# Patient Record
Sex: Male | Born: 1975 | Race: Black or African American | Hispanic: No | Marital: Single | Smoking: Former smoker
Health system: Southern US, Community
[De-identification: ages and names within clinical notes are randomized; demographics above are authoritative.]

---

## 2003-09-28 ENCOUNTER — Emergency Department (HOSPITAL_COMMUNITY): Admission: EM | Admit: 2003-09-28 | Discharge: 2003-09-29 | Payer: Self-pay | Admitting: Emergency Medicine

## 2006-03-06 ENCOUNTER — Emergency Department (HOSPITAL_COMMUNITY): Admission: EM | Admit: 2006-03-06 | Discharge: 2006-03-06 | Payer: Self-pay | Admitting: Family Medicine

## 2006-08-16 ENCOUNTER — Emergency Department (HOSPITAL_COMMUNITY): Admission: EM | Admit: 2006-08-16 | Discharge: 2006-08-16 | Payer: Self-pay | Admitting: Emergency Medicine

## 2011-06-19 ENCOUNTER — Encounter (HOSPITAL_COMMUNITY): Payer: Self-pay | Admitting: Emergency Medicine

## 2011-06-19 ENCOUNTER — Emergency Department (HOSPITAL_COMMUNITY)
Admission: EM | Admit: 2011-06-19 | Discharge: 2011-06-19 | Disposition: A | Discharge status: Home / Self Care | Payer: Self-pay | Attending: Emergency Medicine | Admitting: Emergency Medicine

## 2011-06-19 DIAGNOSIS — M545 Low back pain, unspecified: Secondary | ICD-10-CM | POA: Insufficient documentation

## 2011-06-19 DIAGNOSIS — M5432 Sciatica, left side: Secondary | ICD-10-CM

## 2011-06-19 DIAGNOSIS — M5430 Sciatica, unspecified side: Secondary | ICD-10-CM | POA: Insufficient documentation

## 2011-06-19 DIAGNOSIS — F17200 Nicotine dependence, unspecified, uncomplicated: Secondary | ICD-10-CM | POA: Insufficient documentation

## 2011-06-19 MED ORDER — NAPROXEN 500 MG PO TABS: 500.0000 mg | ORAL_TABLET | Freq: Two times a day (BID) | ORAL | Status: AC

## 2011-06-19 MED ORDER — CYCLOBENZAPRINE HCL 10 MG PO TABS: 10.0000 mg | ORAL_TABLET | Freq: Two times a day (BID) | ORAL | Status: AC | PRN

## 2011-06-19 MED ORDER — HYDROCODONE-ACETAMINOPHEN 5-325 MG PO TABS
1.0000 | ORAL_TABLET | Freq: Once | ORAL | Status: AC
  Administered 2011-06-19: 1 via ORAL
  Filled 2011-06-19: qty 1

## 2011-06-19 MED ORDER — HYDROCODONE-ACETAMINOPHEN 5-325 MG PO TABS: ORAL_TABLET | ORAL | Status: AC

## 2011-06-19 MED ORDER — CYCLOBENZAPRINE HCL 10 MG PO TABS
5.0000 mg | ORAL_TABLET | Freq: Once | ORAL | Status: AC
  Administered 2011-06-19: 5 mg via ORAL
  Filled 2011-06-19: qty 1

## 2011-06-19 NOTE — ED Notes (Addendum)
Pt c/o flank pain mainly on left side but reports it radiates towards the left. Pt denies injury & fall. Pt also reports he has had a recent change in his voice, he has noticed it has been more hoarse X 4 days, pt reports he has also had a sore throat X 4 days. Pt stated he vomited a few days ago, denies n/v/d.

## 2011-06-19 NOTE — ED Provider Notes (Signed)
History     CSN: [id number]  Arrival date & time 06/19/11  1057   First MD Initiated Contact with Patient 06/19/11 1146      Chief Complaint  Patient presents with  . Flank Pain    left    (Consider location/radiation/quality/duration/timing/severity/associated sxs/prior treatment) HPI Comments: 36 yo black male presents to the ED today for back pain since Friday.  Pain is a 'gnawing' pain in the left lower back that radiates down the back of the left leg to the thigh, but not past the knee.  Aggravated by driving, sitting up, bending.  No weakness, loss of bowel or bladder control, unexplained weight loss.  No history of injury to the area. Denies dysuria, polyuria, fever. No h/o cancer, IVDU. No treatments prior, patient states 'I won't take pills'.   Patient is a 36 y.o. male presenting with back pain. The history is provided by the patient.  Back Pain  This is a new problem. The current episode started 2 days ago. The problem occurs constantly. The problem has not changed since onset.The pain is associated with no known injury. The pain is present in the lumbar spine. The quality of the pain is described as stabbing and shooting. The pain radiates to the left thigh. The pain is moderate. The symptoms are aggravated by bending, twisting and certain positions. Pertinent negatives include no fever, no numbness, no weight loss, no abdominal pain, no bowel incontinence, no bladder incontinence, no dysuria, no tingling and no weakness. He has tried nothing for the symptoms.    History reviewed. No pertinent past medical history.  History reviewed. No pertinent past surgical history.  History reviewed. No pertinent family history.  History  Substance Use Topics  . Smoking status: Current Everyday Smoker  . Smokeless tobacco: Not on file  . Alcohol Use: No      Review of Systems  Constitutional: Negative for fever, chills, weight loss, fatigue and unexpected weight change.    Gastrointestinal: Negative for abdominal pain, constipation and bowel incontinence.       Neg for fecal incontinence  Genitourinary: Negative for bladder incontinence, dysuria, frequency, hematuria and difficulty urinating.       Negative for urinary incontinence or retention  Musculoskeletal: Positive for back pain. Negative for arthralgias.  Neurological: Negative for tingling, weakness and numbness.       Negative for saddle paresthesias     Allergies  Review of patient's allergies indicates no known allergies.  Home Medications  No current outpatient prescriptions on file.  BP 121/73  Pulse 55  Temp(Src) 98.1 F (36.7 C) (Oral)  Resp 20  SpO2 98%  Physical Exam  Nursing note and vitals reviewed. Constitutional: He is oriented to person, place, and time. He appears well-developed and well-nourished. No distress.  HENT:  Head: Normocephalic and atraumatic.  Eyes: Conjunctivae are normal.  Neck: Normal range of motion.  Cardiovascular: Normal rate, regular rhythm and normal heart sounds.   Pulmonary/Chest: Effort normal and breath sounds normal. No respiratory distress.  Abdominal: Soft. Bowel sounds are normal. There is no tenderness. There is no CVA tenderness.  Musculoskeletal: Normal range of motion. He exhibits no tenderness.       Right hip: Normal.       Left hip: Normal.       Cervical back: He exhibits no tenderness and no bony tenderness.       Thoracic back: He exhibits no tenderness and no bony tenderness.       Lumbar  back: He exhibits tenderness. He exhibits no bony tenderness.       Tenderness upon palpation of left lower back.  No tenderness upon palpation of the lower left extremity.  Decreased ROM with forward bending of the back and left side bending.  Positive straight leg test. Strength 5/5 in bilateral lower extremities.  Neurological: He is alert and oriented to person, place, and time. He has normal reflexes. No sensory deficit. He exhibits normal  muscle tone.       Sensation present to light touch in bilateral lower extremities.  Skin: Skin is warm and dry.  Psychiatric: He has a normal mood and affect.    ED Course  Procedures (including critical care time)  Labs Reviewed - No data to display No results found.   1. Sciatica of left side     Patient seen and examined. Exam consistent with sciatica.   Vital signs reviewed and are as follows: Filed Vitals:   06/19/11 1121  BP: 121/73  Pulse:   Temp:   Resp:   BP 121/73  Pulse 55  Temp(Src) 98.1 F (36.7 C) (Oral)  Resp 20  SpO2 98%  No red flag s/s of low back pain. Patient was counseled on back pain precautions and told to do activity as tolerated but do not lift, push, or pull heavy objects more than 10 pounds for the next week.  Patient counseled to use ice or heat on back for no longer than 15 minutes every hour.   Patient prescribed muscle relaxer and counseled on proper use of muscle relaxant medication.    Patient prescribed narcotic pain medicine and counseled on proper use of narcotic pain medications. Counseled not to combine this medication with others containing tylenol.   Urged patient not to drink alcohol, drive, or perform any other activities that requires focus while taking either of these medications.  Patient urged to follow-up with PCP if pain does not improve with treatment and rest or if pain becomes recurrent. Urged to return with worsening severe pain, loss of bowel or bladder control, trouble walking.   The patient verbalizes understanding and agrees with the plan.  MDM  Patient with back pain. Discrete track of pain down leg in dermatomal pattern. No neurological deficits. Patient is ambulatory. No warning symptoms of back pain including: loss of bowel or bladder control, night sweats, waking from sleep with back pain, unexplained fevers or weight loss, h/o cancer, IVDU, recent trauma. No concern for cauda equina, epidural abscess, or other  serious cause of back pain. Conservative measures such as rest, ice/heat and pain medicine indicated with PCP follow-up if no improvement with conservative management.          Renne Crigler, [place] 06/19/11 [phone]

## 2011-06-19 NOTE — ED Notes (Signed)
Pt reports left flank pain onset Friday. Pt denies urinary issues.

## 2011-06-19 NOTE — Discharge Instructions (Signed)
Please read and follow all provided instructions.  Your diagnoses today include:  1. Sciatica of left side     Tests performed today include:  Vital signs - see below for your results today  Medications prescribed:   Vicodin (hydrocodone/acetaminophen) - narcotic pain medication  You have been prescribed narcotic pain medication such as Vicodin or Percocet: DO NOT drive or perform any activities that require you to be awake and alert because this medicine can make you drowsy. BE VERY CAREFUL not to take multiple medicines containing Tylenol (also called acetaminophen). Doing so can lead to an overdose which can damage your liver and cause liver failure and possibly death.    Flexeril (cyclobenzaprine) - muscle relaxer medication  You have been prescribed a muscle relaxer medication such as Robaxin, Flexeril, or Valium: DO NOT drive or perform any activities that require you to be awake and alert because this medicine can make you drowsy.    Naproxen - anti-inflammatory pain medication  Do not exceed 500mg  naproxen every 12 hours  You have been prescribed an anti-inflammatory medication or NSAID. Take with food. Take smallest effective dose for the shortest duration needed for your pain. Stop taking if you experience stomach pain or vomiting.   Take any prescribed medications only as directed.  Home care instructions:   Follow any educational materials contained in this packet  Please rest, use ice or heat on your back for the next several days  Do not lift, push, pull anything more than 10 pounds for the next week  Follow-up instructions: Please follow-up with your primary care provider in the next 1 week for further evaluation of your symptoms. If you do not have a primary care doctor -- see below for referral information.   Return instructions:  SEEK IMMEDIATE MEDICAL ATTENTION IF YOU HAVE:  New numbness, tingling, weakness, or problem with the use of your arms or  legs  Severe back pain not relieved with medications  Loss control of your bowels or bladder  Increasing pain in any areas of the body (such as chest or abdominal pain)  Shortness of breath, dizziness, or fainting.   Worsening nausea (feeling sick to your stomach), vomiting, fever, or sweats  Any other emergent concerns regarding your health   Additional Information:  Your vital signs today were: BP 121/73  Pulse 55  Temp(Src) 98.1 F (36.7 C) (Oral)  Resp 20  SpO2 98% If your blood pressure (BP) was elevated above 135/85 this visit, please have this repeated by your doctor within one month. -------------- No Primary Care Doctor Call [facility]  [phone] Other agencies that provide inexpensive medical care    [facility]  [phone]    [facility]  [phone]    [facility]  [phone]    [facility]  [phone]    [facility]  [phone]    [facility]  [phone] -------------- RESOURCE GUIDE:  Dental Problems  Patients with Medicaid: [facility] [id number]                                            [phone] [address] Phone:  [phone]  Phone:  [phone]  If unable to pay or uninsured, contact:  [facility] or [facility]. to become qualified for the adult dental clinic.  Chronic Pain Problems Contact [facility]  [phone] Patients need to be referred by their primary care doctor.  Insufficient Money for Medicine Contact [employer]:  call "211" or [facility] [phone].  Psychological Services [facility]  [phone] [facility]  [phone] [facility]   [phone] (emergency services [phone])  Substance Abuse Resources [facility]  [phone] [facility] [phone] The  [place] [phone] Floydene Flock [phone] Residential & Outpatient Substance Abuse Program  [phone]  Abuse/Neglect [facility] Child Abuse Hotline 364-082-1054 [facility] Child Abuse Hotline [phone] (After Hours)  Emergency Shelter [facility] [phone]  Maternity Homes [facility] [place] [facility] [place] [phone] [place] [facility] [phone]  [facility] Resources  [facility]     [employer]                          [facility] Dept. [address] [place]                       [address]      [address]  Blondell Reveal Phone:  [phone]                                   Phone:  [phone]                 Phone:  [phone]  [facility] Phone:  [phone]  [facility] Child Abuse Hotline [phone] (After Hours)

## 2011-06-22 NOTE — ED Provider Notes (Signed)
Medical screening examination/treatment/procedure(s) were conducted as a shared visit with non-physician practitioner(s) and myself.  I personally evaluated the patient during the encounter.  History and physical most consistent with left-sided sciatica.  Donnetta Hutching, MD 06/22/11 [phone]

## 2012-08-02 ENCOUNTER — Ambulatory Visit: Payer: Self-pay | Admitting: Physician Assistant

## 2012-08-02 VITALS — BP 105/64 | HR 78 | Temp 98.0°F | Resp 17 | Ht 71.5 in | Wt 244.0 lb

## 2012-08-02 DIAGNOSIS — L509 Urticaria, unspecified: Secondary | ICD-10-CM

## 2012-08-02 MED ORDER — HYDROXYZINE HCL 25 MG PO TABS: 12.5000 mg | ORAL_TABLET | Freq: Three times a day (TID) | ORAL | Status: DC | PRN

## 2012-08-02 MED ORDER — CETIRIZINE HCL 10 MG PO TABS: 10.0000 mg | ORAL_TABLET | Freq: Every day | ORAL | Status: DC

## 2012-08-02 MED ORDER — PREDNISONE 20 MG PO TABS: ORAL_TABLET | ORAL | Status: DC

## 2012-08-02 MED ORDER — DIPHENHYDRAMINE HCL 2 % EX GEL: 1.0000 "application " | Freq: Four times a day (QID) | CUTANEOUS | Status: DC | PRN

## 2012-08-02 NOTE — Patient Instructions (Addendum)
Begin taking the steroid (prednisone) as directed.  Be sure to finish the full course.  Use Zyrtec (cetirizine) once daily in the morning - this will help with itching.  Use Atarax (hydroxyzine) at bed time to help with itching - this may make you sleepy!  Topical Benadryl (diphenhydramine) gel for itch relief.  If any symptoms are worsening or not improving, or if you develop new symptoms, please let us know.     Hives Hives are itchy, red, swollen areas of the skin. They can vary in size and location on your body. Hives can come and go for hours or several days (acute hives) or for several weeks (chronic hives). Hives do not spread from person to person (noncontagious). They may get worse with scratching, exercise, and emotional stress. CAUSES   Allergic reaction to food, additives, or drugs.  Infections, including the common cold.  Illness, such as vasculitis, lupus, or thyroid disease.  Exposure to sunlight, heat, or cold.  Exercise.  Stress.  Contact with chemicals. SYMPTOMS   Red or white swollen patches on the skin. The patches may change size, shape, and location quickly and repeatedly.  Itching.  Swelling of the hands, feet, and face. This may occur if hives develop deeper in the skin. DIAGNOSIS  Your caregiver can usually tell what is wrong by performing a physical exam. Skin or blood tests may also be done to determine the cause of your hives. In some cases, the cause cannot be determined. TREATMENT  Mild cases usually get better with medicines such as antihistamines. Severe cases may require an emergency epinephrine injection. If the cause of your hives is known, treatment includes avoiding that trigger.  HOME CARE INSTRUCTIONS   Avoid causes that trigger your hives.  Take antihistamines as directed by your caregiver to reduce the severity of your hives. Non-sedating or low-sedating antihistamines are usually recommended. Do not drive while taking an  antihistamine.  Take any other medicines prescribed for itching as directed by your caregiver.  Wear loose-fitting clothing.  Keep all follow-up appointments as directed by your caregiver. SEEK MEDICAL CARE IF:   You have persistent or severe itching that is not relieved with medicine.  You have painful or swollen joints. SEEK IMMEDIATE MEDICAL CARE IF:   You have a fever.  Your tongue or lips are swollen.  You have trouble breathing or swallowing.  You feel tightness in the throat or chest.  You have abdominal pain. These problems may be the first sign of a life-threatening allergic reaction. Call your local emergency services (911 in [place] MAKE SURE YOU:   Understand these instructions.  Will watch your condition.  Will get help right away if you are not doing well or get worse. Document Released: 02/07/2005 Document Revised: 08/09/2011 Document Reviewed: 05/03/2011 [facility] Patient Information 2014 [place], [place].

## 2012-08-02 NOTE — Progress Notes (Signed)
  Subjective:    Patient ID: Marcus Sweeney, male    DOB: 07-08-1975, 37 y.o.   MRN: [id number]  HPI   Marcus Sweeney is a pleasant 37 yr old male here with concern for "rashes all over my body."  States the rash began this AM out of nowhere.  There are areas all over arms, legs, back.  The areas are itchy and a little painful.  He has never had anything like this before.  But also admits he has not been to a doctor since 1999.  He denies any new exposures. He specifically denies new soaps, detergents, cologne, foods, clothing, plants, medication or other substances.  Thinks perhaps he was bitten by something, but has not seen any bugs.  Has not noted bed bugs.  No one else at home or work is itching.  He has not used anything for symptoms yet.  No other associated symptoms.   Review of Systems  Constitutional: Negative for fever, chills and fatigue.  HENT: Negative.   Respiratory: Negative.   Cardiovascular: Negative.   Gastrointestinal: Negative.   Musculoskeletal: Negative.   Skin: Positive for rash.  Neurological: Negative.        Objective:   Physical Exam  Vitals reviewed. Constitutional: He is oriented to person, place, and time. He appears well-developed and well-nourished. No distress.  HENT:  Head: Normocephalic and atraumatic.  Eyes: Conjunctivae are normal. No scleral icterus.  Cardiovascular: Normal rate, regular rhythm and normal heart sounds.   Pulmonary/Chest: Effort normal and breath sounds normal. He has no wheezes. He has no rales.  Abdominal: Soft. There is no tenderness.  Neurological: He is alert and oriented to person, place, and time.  Skin: Skin is warm and dry. Rash noted. Rash is urticarial.     Multiple raised lesions scattered diffusely, slightly erythematous; some lesions appear c/w insect bite but others appear more urticarial; no vesicles or bullae; no involvement of palms, soles, or oral mucosa  Psychiatric: He has a normal mood and affect. His  behavior is normal.         Assessment & Plan:  Urticaria - Plan: predniSONE (DELTASONE) 20 MG tablet, hydrOXYzine (ATARAX/VISTARIL) 25 MG tablet, cetirizine (ZYRTEC) 10 MG tablet, DIPHENHYDRAMINE HCL, TOPICAL, 2 % GEL   Marcus Sweeney is a 37 yr old male with urticaria.  The inciting event is unclear at this point.  Will treat with prednisone taper and antihistamines.  Discussed RTC precautions with patient who understands and is in agreement with this plan.

## 2013-04-12 ENCOUNTER — Emergency Department (HOSPITAL_COMMUNITY)
Admission: EM | Admit: 2013-04-12 | Discharge: 2013-04-12 | Disposition: A | Discharge status: Home / Self Care | Payer: Self-pay | Attending: Emergency Medicine | Admitting: Emergency Medicine

## 2013-04-12 ENCOUNTER — Emergency Department (HOSPITAL_COMMUNITY): Payer: Self-pay

## 2013-04-12 ENCOUNTER — Encounter (HOSPITAL_COMMUNITY): Payer: Self-pay | Admitting: Emergency Medicine

## 2013-04-12 DIAGNOSIS — H538 Other visual disturbances: Secondary | ICD-10-CM | POA: Insufficient documentation

## 2013-04-12 DIAGNOSIS — J029 Acute pharyngitis, unspecified: Secondary | ICD-10-CM | POA: Insufficient documentation

## 2013-04-12 DIAGNOSIS — H53149 Visual discomfort, unspecified: Secondary | ICD-10-CM | POA: Insufficient documentation

## 2013-04-12 DIAGNOSIS — J329 Chronic sinusitis, unspecified: Secondary | ICD-10-CM | POA: Insufficient documentation

## 2013-04-12 DIAGNOSIS — Z87891 Personal history of nicotine dependence: Secondary | ICD-10-CM | POA: Insufficient documentation

## 2013-04-12 LAB — CBC WITH DIFFERENTIAL/PLATELET
Basophils Absolute: 0 10*3/uL (ref 0.0–0.1)
Basophils Relative: 0 % (ref 0–1)
EOS PCT: 0 % (ref 0–5)
Eosinophils Absolute: 0 10*3/uL (ref 0.0–0.7)
HEMATOCRIT: 41.2 % (ref 39.0–52.0)
Hemoglobin: 14.3 g/dL (ref 13.0–17.0)
LYMPHS ABS: 1.1 10*3/uL (ref 0.7–4.0)
LYMPHS PCT: 6 % — AB (ref 12–46)
MCH: 32.8 pg (ref 26.0–34.0)
MCHC: 34.7 g/dL (ref 30.0–36.0)
MCV: 94.5 fL (ref 78.0–100.0)
MONO ABS: 2.4 10*3/uL — AB (ref 0.1–1.0)
MONOS PCT: 12 % (ref 3–12)
Neutro Abs: 16.2 10*3/uL — ABNORMAL HIGH (ref 1.7–7.7)
Neutrophils Relative %: 82 % — ABNORMAL HIGH (ref 43–77)
Platelets: 202 10*3/uL (ref 150–400)
RBC: 4.36 MIL/uL (ref 4.22–5.81)
RDW: 13.2 % (ref 11.5–15.5)
WBC: 19.9 10*3/uL — AB (ref 4.0–10.5)

## 2013-04-12 LAB — COMPREHENSIVE METABOLIC PANEL
ALT: 52 U/L (ref 0–53)
AST: 28 U/L (ref 0–37)
Albumin: 4 g/dL (ref 3.5–5.2)
Alkaline Phosphatase: 122 U/L — ABNORMAL HIGH (ref 39–117)
BUN: 15 mg/dL (ref 6–23)
CALCIUM: 9.6 mg/dL (ref 8.4–10.5)
CO2: 27 meq/L (ref 19–32)
CREATININE: 1.08 mg/dL (ref 0.50–1.35)
Chloride: 100 mEq/L (ref 96–112)
GFR, EST NON AFRICAN AMERICAN: 87 mL/min — AB (ref >90)
GLUCOSE: 121 mg/dL — AB (ref 70–99)
Potassium: 4.3 mEq/L (ref 3.7–5.3)
Sodium: 140 mEq/L (ref 137–147)
Total Bilirubin: 0.9 mg/dL (ref 0.3–1.2)
Total Protein: 7.9 g/dL (ref 6.0–8.3)

## 2013-04-12 MED ORDER — SODIUM CHLORIDE 0.9 % IV SOLN
INTRAVENOUS | Status: DC
  Administered 2013-04-12 (×2): via INTRAVENOUS

## 2013-04-12 MED ORDER — DEXAMETHASONE SODIUM PHOSPHATE 10 MG/ML IJ SOLN
10.0000 mg | Freq: Once | INTRAMUSCULAR | Status: AC
  Administered 2013-04-12: 10 mg via INTRAVENOUS
  Filled 2013-04-12: qty 1

## 2013-04-12 MED ORDER — HYDROMORPHONE HCL PF 1 MG/ML IJ SOLN
1.0000 mg | Freq: Once | INTRAMUSCULAR | Status: AC
  Administered 2013-04-12: 1 mg via INTRAVENOUS
  Filled 2013-04-12: qty 1

## 2013-04-12 MED ORDER — AMOXICILLIN-POT CLAVULANATE 875-125 MG PO TABS: 1.0000 | ORAL_TABLET | Freq: Two times a day (BID) | ORAL | Status: DC

## 2013-04-12 MED ORDER — DIPHENHYDRAMINE HCL 50 MG/ML IJ SOLN
25.0000 mg | Freq: Once | INTRAMUSCULAR | Status: AC
  Administered 2013-04-12: 25 mg via INTRAVENOUS
  Filled 2013-04-12: qty 1

## 2013-04-12 MED ORDER — SODIUM CHLORIDE 0.9 % IV SOLN
3.0000 g | Freq: Once | INTRAVENOUS | Status: AC
  Administered 2013-04-12: 3 g via INTRAVENOUS
  Filled 2013-04-12: qty 3

## 2013-04-12 MED ORDER — SODIUM CHLORIDE 0.9 % IV BOLUS (SEPSIS)
1000.0000 mL | Freq: Once | INTRAVENOUS | Status: AC
  Administered 2013-04-12: 1000 mL via INTRAVENOUS

## 2013-04-12 MED ORDER — PROMETHAZINE HCL 25 MG/ML IJ SOLN
12.5000 mg | Freq: Once | INTRAMUSCULAR | Status: AC
  Administered 2013-04-12: 12.5 mg via INTRAVENOUS
  Filled 2013-04-12: qty 1

## 2013-04-12 MED ORDER — HYDROCODONE-ACETAMINOPHEN 5-325 MG PO TABS
1.0000 | ORAL_TABLET | Freq: Four times a day (QID) | ORAL | Status: DC | PRN
Start: 2013-04-12 — End: 2017-11-10

## 2013-04-12 NOTE — ED Notes (Signed)
Pt to ct 

## 2013-04-12 NOTE — Discharge Instructions (Signed)
Sinus Headache A sinus headache happens when your sinuses become clogged or puffy (swollen). Sinus headaches can be mild or severe. HOME CARE  Take your medicines (antibiotics) as told. Finish them even if you start to feel better.  Only take medicine as told by your doctor.  Use a nose spray if you feel stuffed up (congested). GET HELP RIGHT AWAY IF:  You have a fever.  You have trouble seeing.  You suddenly have pain in your face or head.  You start to twitch or shake (seizure).  You are confused.  You get headaches more than once a week.  Light or sound bothers you.  You feel sick to your stomach (nauseous) or throw up (vomit).  Your headaches do not get better with treatment. MAKE SURE YOU:  Understand these instructions.  Will watch your condition.  Will get help right away if you are not doing well or get worse. Document Released: 06/09/2010 Document Revised: 05/02/2011 Document Reviewed: 06/09/2010 [facility] Patient Information 2014 [place], [place].  Sinusitis Sinusitis is redness, soreness, and swelling (inflammation) of the paranasal sinuses. Paranasal sinuses are air pockets within the bones of your face (beneath the eyes, the middle of the forehead, or above the eyes). In healthy paranasal sinuses, mucus is able to drain out, and air is able to circulate through them by way of your nose. However, when your paranasal sinuses are inflamed, mucus and air can become trapped. This can allow bacteria and other germs to grow and cause infection. Sinusitis can develop quickly and last only a short time (acute) or continue over a long period (chronic). Sinusitis that lasts for more than 12 weeks is considered chronic.  CAUSES  Causes of sinusitis include:  Allergies.  Structural abnormalities, such as displacement of the cartilage that separates your nostrils (deviated septum), which can decrease the air flow through your nose and sinuses and affect sinus  drainage.  Functional abnormalities, such as when the small hairs (cilia) that line your sinuses and help remove mucus do not work properly or are not present. SYMPTOMS  Symptoms of acute and chronic sinusitis are the same. The primary symptoms are pain and pressure around the affected sinuses. Other symptoms include:  Upper toothache.  Earache.  Headache.  Bad breath.  Decreased sense of smell and taste.  A cough, which worsens when you are lying flat.  Fatigue.  Fever.  Thick drainage from your nose, which often is green and may contain pus (purulent).  Swelling and warmth over the affected sinuses. DIAGNOSIS  Your caregiver will perform a physical exam. During the exam, your caregiver may:  Look in your nose for signs of abnormal growths in your nostrils (nasal polyps).  Tap over the affected sinus to check for signs of infection.  View the inside of your sinuses (endoscopy) with a special imaging device with a light attached (endoscope), which is inserted into your sinuses. If your caregiver suspects that you have chronic sinusitis, one or more of the following tests may be recommended:  Allergy tests.  Nasal culture A sample of mucus is taken from your nose and sent to a lab and screened for bacteria.  Nasal cytology A sample of mucus is taken from your nose and examined by your caregiver to determine if your sinusitis is related to an allergy. TREATMENT  Most cases of acute sinusitis are related to a viral infection and will resolve on their own within 10 days. Sometimes medicines are prescribed to help relieve symptoms (pain medicine,  decongestants, nasal steroid sprays, or saline sprays).  However, for sinusitis related to a bacterial infection, your caregiver will prescribe antibiotic medicines. These are medicines that will help kill the bacteria causing the infection.  Rarely, sinusitis is caused by a fungal infection. In theses cases, your caregiver will  prescribe antifungal medicine. For some cases of chronic sinusitis, surgery is needed. Generally, these are cases in which sinusitis recurs more than 3 times per year, despite other treatments. HOME CARE INSTRUCTIONS   Drink plenty of water. Water helps thin the mucus so your sinuses can drain more easily.  Use a humidifier.  Inhale steam 3 to 4 times a day (for example, sit in the bathroom with the shower running).  Apply a warm, moist washcloth to your face 3 to 4 times a day, or as directed by your caregiver.  Use saline nasal sprays to help moisten and clean your sinuses.  Take over-the-counter or prescription medicines for pain, discomfort, or fever only as directed by your caregiver. SEEK IMMEDIATE MEDICAL CARE IF:  You have increasing pain or severe headaches.  You have nausea, vomiting, or drowsiness.  You have swelling around your face.  You have vision problems.  You have a stiff neck.  You have difficulty breathing. MAKE SURE YOU:   Understand these instructions.  Will watch your condition.  Will get help right away if you are not doing well or get worse. Document Released: 02/07/2005 Document Revised: 05/02/2011 Document Reviewed: 02/22/2011 [facility] Patient Information 2014 [place].  Take antibiotic as directed and until completed. Take pain medicine as needed. Work note provided. Return for any newer worse symptoms which include development of fever.

## 2013-04-12 NOTE — ED Notes (Signed)
Promethazine just delivered from pharmacy, will administer when patient returns from CT

## 2013-04-12 NOTE — ED Notes (Signed)
Vital signs stable. 

## 2013-04-12 NOTE — ED Notes (Signed)
Did not take any medications before coming here, patient was able to drive himself here

## 2013-04-12 NOTE — ED Provider Notes (Signed)
CSN: [id number]     Arrival date & time 04/12/13  0702 History   First MD Initiated Contact with Patient 04/12/13 [phone]     Chief Complaint  Patient presents with  . Blurred Vision  . Headache     (Consider location/radiation/quality/duration/timing/severity/associated sxs/prior Treatment) Patient is a 38 y.o. male presenting with headaches. The history is provided by the patient.  Headache Associated symptoms: photophobia, sinus pressure and sore throat   Associated symptoms: no abdominal pain, no congestion, no fever and no neck pain    patient with complaint of headache on the left side blurry vision for the past 12 hours the headache feels behind left eye described as sharp light hurts his eyes. No nausea vomiting or diarrhea. No fever. Does feel as if he has a mild sore throat. No focal neuro deficits.  History reviewed. No pertinent past medical history. History reviewed. No pertinent past surgical history. No family history on file. History  Substance Use Topics  . Smoking status: Former Smoker    Types: Cigarettes    Quit date: 07/19/2012  . Smokeless tobacco: Not on file  . Alcohol Use: No     Comment: "once a week"    Review of Systems  Constitutional: Negative for fever.  HENT: Positive for sinus pressure and sore throat. Negative for congestion.   Eyes: Positive for photophobia and visual disturbance. Negative for redness.  Respiratory: Negative for shortness of breath.   Cardiovascular: Negative for chest pain.  Gastrointestinal: Negative for abdominal pain.  Genitourinary: Negative for dysuria.  Musculoskeletal: Negative for neck pain.  Skin: Negative for rash.  Neurological: Negative for headaches.  Hematological: Does not bruise/bleed easily.  Psychiatric/Behavioral: Negative for confusion.      Allergies  Review of patient's allergies indicates no known allergies.  Home Medications   Current Outpatient Rx  Name  Route  Sig  Dispense  Refill  .  amoxicillin-clavulanate (AUGMENTIN) 875-125 MG per tablet   Oral   Take 1 tablet by mouth 2 (two) times daily.   20 tablet   1   . HYDROcodone-acetaminophen (NORCO/VICODIN) 5-325 MG per tablet   Oral   Take 1-2 tablets by mouth every 6 (six) hours as needed for moderate pain.   20 tablet   0    BP 107/53  Pulse 82  Temp(Src) 99.2 F (37.3 C) (Oral)  Resp 16  Ht 6\' 1"  (1.854 m)  Wt 250 lb (113.399 kg)  BMI 32.99 kg/m2  SpO2 97% Physical Exam  Nursing note and vitals reviewed. Constitutional: He is oriented to person, place, and time. He appears well-developed and well-nourished. He appears distressed.  HENT:  Head: Atraumatic.  Mouth/Throat: Oropharynx is clear and moist.  Patient with tenderness to palpation to left maxillary and left frontal sinus area.  Eyes: Conjunctivae and EOM are normal. Pupils are equal, round, and reactive to light.  Neck: Normal range of motion.  Cardiovascular: Normal rate, regular rhythm and normal heart sounds.   No murmur heard. Pulmonary/Chest: Effort normal and breath sounds normal.  Abdominal: Soft. Bowel sounds are normal. There is no tenderness.  Musculoskeletal: Normal range of motion.  Neurological: He is alert and oriented to person, place, and time. No cranial nerve deficit. He exhibits normal muscle tone. Coordination normal.  Skin: Skin is warm.    ED Course  Procedures (including critical care time) Labs Review Labs Reviewed  CBC WITH DIFFERENTIAL - Abnormal; Notable for the following:    WBC 19.9 (*)  Neutrophils Relative % 82 (*)    Neutro Abs 16.2 (*)    Lymphocytes Relative 6 (*)    Monocytes Absolute 2.4 (*)    All other components within normal limits  COMPREHENSIVE METABOLIC PANEL - Abnormal; Notable for the following:    Glucose, Bld 121 (*)    Alkaline Phosphatase 122 (*)    GFR calc non Af Amer 87 (*)    All other components within normal limits   Results for orders placed during the hospital encounter of  04/12/13  CBC WITH DIFFERENTIAL      Result Value Ref Range   WBC 19.9 (*) 4.0 - 10.5 K/uL   RBC 4.36  4.22 - 5.81 MIL/uL   Hemoglobin 14.3  13.0 - 17.0 g/dL   HCT 16.141.2  09.639.0 - 04.552.0 %   MCV 94.5  78.0 - 100.0 fL   MCH 32.8  26.0 - 34.0 pg   MCHC 34.7  30.0 - 36.0 g/dL   RDW 40.913.2  81.111.5 - 91.415.5 %   Platelets 202  150 - 400 K/uL   Neutrophils Relative % 82 (*) 43 - 77 %   Neutro Abs 16.2 (*) 1.7 - 7.7 K/uL   Lymphocytes Relative 6 (*) 12 - 46 %   Lymphs Abs 1.1  0.7 - 4.0 K/uL   Monocytes Relative 12  3 - 12 %   Monocytes Absolute 2.4 (*) 0.1 - 1.0 K/uL   Eosinophils Relative 0  0 - 5 %   Eosinophils Absolute 0.0  0.0 - 0.7 K/uL   Basophils Relative 0  0 - 1 %   Basophils Absolute 0.0  0.0 - 0.1 K/uL  COMPREHENSIVE METABOLIC PANEL      Result Value Ref Range   Sodium 140  137 - 147 mEq/L   Potassium 4.3  3.7 - 5.3 mEq/L   Chloride 100  96 - 112 mEq/L   CO2 27  19 - 32 mEq/L   Glucose, Bld 121 (*) 70 - 99 mg/dL   BUN 15  6 - 23 mg/dL   Creatinine, Ser 7.821.08  0.50 - 1.35 mg/dL   Calcium 9.6  8.4 - 95.610.5 mg/dL   Total Protein 7.9  6.0 - 8.3 g/dL   Albumin 4.0  3.5 - 5.2 g/dL   AST 28  0 - 37 U/L   ALT 52  0 - 53 U/L   Alkaline Phosphatase 122 (*) 39 - 117 U/L   Total Bilirubin 0.9  0.3 - 1.2 mg/dL   GFR calc non Af Amer 87 (*) >90 mL/min   GFR calc Af Amer >90  >90 mL/min    Imaging Review Ct Head Wo Contrast  04/12/2013   CLINICAL DATA:  Headache and blurred vision for 12 hr.  EXAM: CT HEAD WITHOUT CONTRAST  TECHNIQUE: Contiguous axial images were obtained from the base of the skull through the vertex without intravenous contrast.  COMPARISON:  None.  FINDINGS: The ventricles and sulci are within normal limits for age. There is no evidence of acute infarct, intracranial hemorrhage, mass, midline shift, or extra-axial collection. The orbits are unremarkable. The mastoid air cells are clear.  There is mild anterior left ethmoid air cell mucosal thickening. There is a small amount of  fluid in the left sphenoid sinus. There is near complete opacification of the left maxillary sinus, and there is mild right maxillary sinus mucosal thickening, incompletely visualized. There is no evidence of acute fracture.  IMPRESSION: 1. No evidence of acute intracranial  abnormality. 2. Paranasal sinus disease as above, with near complete left maxillary sinus opacification. Correlate clinically for signs of acute sinusitis.   Electronically Signed   By: Sebastian Ache   On: 04/12/2013 08:16    EKG Interpretation   None       MDM   Final diagnoses:  Sinusitis     Head CT shows a significant left the axillary sinusitis. Is exactly where the patient's symptoms are. Head CT otherwise negative. Patient given some IV antibiotics here. Continue on Augmentin at home. And pain medication. Work note provided. Patient return for any newer worse symptoms. Patient's symptoms are somewhat consistent with a migraine headache but CT scan does confirm the sinusitis.  Patient originally treated as if it was a migraine headache significant improvement with pain medicine and fluids all symptoms resolved. Except for the pressure was still present but the blurry vision rate improved. Still suspect is related to the sinusitis.  Shelda Jakes, MD 04/12/13 [phone]

## 2013-04-12 NOTE — ED Notes (Signed)
Denies previous fever, body aches, but admits to having a sore throat

## 2013-04-12 NOTE — ED Notes (Signed)
Headache and blurry vision on the left side for 12 hours, headache behind the left eye, described as sharp, hurts to open eyes. No unilateral motor deficits noted, no facial droop noted, denies changes in sensation

## 2015-09-11 ENCOUNTER — Encounter (HOSPITAL_COMMUNITY): Payer: Self-pay | Admitting: Emergency Medicine

## 2015-09-11 ENCOUNTER — Emergency Department (HOSPITAL_COMMUNITY)
Admission: EM | Admit: 2015-09-11 | Discharge: 2015-09-11 | Disposition: A | Discharge status: Home / Self Care | Payer: Self-pay | Attending: Emergency Medicine | Admitting: Emergency Medicine

## 2015-09-11 DIAGNOSIS — M79662 Pain in left lower leg: Secondary | ICD-10-CM | POA: Insufficient documentation

## 2015-09-11 DIAGNOSIS — Z87891 Personal history of nicotine dependence: Secondary | ICD-10-CM | POA: Insufficient documentation

## 2015-09-11 MED ORDER — NAPROXEN 500 MG PO TABS: 500.0000 mg | ORAL_TABLET | Freq: Two times a day (BID) | ORAL | Status: DC

## 2015-09-11 MED ORDER — METHOCARBAMOL 500 MG PO TABS
500.0000 mg | ORAL_TABLET | Freq: Once | ORAL | Status: AC
  Administered 2015-09-11: 500 mg via ORAL
  Filled 2015-09-11: qty 1

## 2015-09-11 MED ORDER — NAPROXEN 250 MG PO TABS
500.0000 mg | ORAL_TABLET | Freq: Once | ORAL | Status: AC
  Administered 2015-09-11: 500 mg via ORAL
  Filled 2015-09-11: qty 2

## 2015-09-11 MED ORDER — METHOCARBAMOL 500 MG PO TABS: 500.0000 mg | ORAL_TABLET | Freq: Two times a day (BID) | ORAL | Status: DC

## 2015-09-11 NOTE — ED Provider Notes (Signed)
CSN: [id number]     Arrival date & time 09/11/15  0806 History   First MD Initiated Contact with Patient 09/11/15 [phone]     Chief Complaint  Patient presents with  . calf pain     HPI   Marcus Sweeney is an 40 y.o. male who presents to the ED for evaluation of left calf pain. He state he was playing basketball last night and came down from a jump shot and felt like his left calf muscle "slid over" and then "slid back into place." he states since then his left calf has been sore and tender. Denies swelling. Denies weakness, numbness, or tingling.  He took some tylenol with minimal relief of symptoms last night. He is still able to ambulate and bear weight.  History reviewed. No pertinent past medical history. History reviewed. No pertinent past surgical history. No family history on file. Social History  Substance Use Topics  . Smoking status: Former Smoker    Types: Cigarettes    Quit date: 07/19/2012  . Smokeless tobacco: None  . Alcohol Use: No     Comment: "once a week"    Review of Systems  All other systems reviewed and are negative.     Allergies  Review of patient's allergies indicates no known allergies.  Home Medications   Prior to Admission medications   Medication Sig Start Date End Date Taking? Authorizing Provider  amoxicillin-clavulanate (AUGMENTIN) 875-125 MG per tablet Take 1 tablet by mouth 2 (two) times daily. 04/12/13   Vanetta MuldersScott Zackowski, MD  HYDROcodone-acetaminophen (NORCO/VICODIN) 5-325 MG per tablet Take 1-2 tablets by mouth every 6 (six) hours as needed for moderate pain. 04/12/13   Vanetta MuldersScott Zackowski, MD   BP 131/73 mmHg  Pulse 70  Temp(Src) 98.2 F (36.8 C) (Oral)  Resp 18  Ht 6\' 1"  (1.854 m)  Wt 104.327 kg  BMI 30.35 kg/m2  SpO2 97% Physical Exam  Constitutional: He is oriented to person, place, and time. No distress.  HENT:  Head: Atraumatic.  Right Ear: External ear normal.  Left Ear: External ear normal.  Nose: Nose normal.  Eyes:  Conjunctivae are normal. No scleral icterus.  Cardiovascular: Normal rate and regular rhythm.   Pulmonary/Chest: Effort normal. No respiratory distress.  Abdominal: He exhibits no distension.  Musculoskeletal:  Medial left calf is tender. No edema. No discoloration. FROM of hips, knee, ankle. 2+ DP. Ambulatory with steady gait.  Neurological: He is alert and oriented to person, place, and time.  Skin: Skin is warm and dry. He is not diaphoretic.  Psychiatric: He has a normal mood and affect. His behavior is normal.  Nursing note and vitals reviewed.   ED Course  Procedures (including critical care time) Labs Review Labs Reviewed - No data to display  Imaging Review No results found. I have personally reviewed and evaluated these images and lab results as part of my medical decision-making.   EKG Interpretation None      MDM   Final diagnoses:  Calf pain, left    Pain is muscular. He is ambulatory and has FROM of proxmial and distal joints. Neurovascularly intact. Will trial NSAIDs and robaxin. Encouraged ortho f/u as needed. ER return precautions given.    Carlene CoriaSerena Y Daviona Herbert, PA-C 09/11/15 [phone]   Loren Raceravid Yelverton, MD 09/27/15 [phone]

## 2015-09-11 NOTE — Discharge Instructions (Signed)
Take medications as prescribed. Please call Dr. Luiz BlareGraves' office for follow up as needed. Return to the ER for new or worsening symptoms.

## 2015-09-11 NOTE — ED Notes (Signed)
Patient states was playing basketball last night and "it felt like my calf muscle slid into the other muscle".  Patient states it "may be torn or something".   Patient denies other symptoms.

## 2016-08-30 ENCOUNTER — Encounter (HOSPITAL_COMMUNITY): Payer: Self-pay | Admitting: Vascular Surgery

## 2016-08-30 ENCOUNTER — Emergency Department (HOSPITAL_COMMUNITY)
Admission: EM | Admit: 2016-08-30 | Discharge: 2016-08-30 | Disposition: A | Discharge status: Home / Self Care | Payer: Self-pay | Attending: Emergency Medicine | Admitting: Emergency Medicine

## 2016-08-30 DIAGNOSIS — Z87891 Personal history of nicotine dependence: Secondary | ICD-10-CM | POA: Insufficient documentation

## 2016-08-30 DIAGNOSIS — Z79899 Other long term (current) drug therapy: Secondary | ICD-10-CM | POA: Insufficient documentation

## 2016-08-30 DIAGNOSIS — R21 Rash and other nonspecific skin eruption: Secondary | ICD-10-CM | POA: Insufficient documentation

## 2016-08-30 NOTE — ED Triage Notes (Signed)
Pt reports to the ED for eval of rash over bilateral thighs x 2 weeks. States that the rash itches and is tender to touch. He states that he does sweat a lot. Has tried OTC creams without relief. Pt reports new soaps but denies any new detergents, medications, or lotion.

## 2016-08-30 NOTE — Discharge Instructions (Signed)
Use Selsun Blue (selenium sulfide) shampoo and indicated the areas once daily for 1 week. We've application on for 10-15 minutes after rinsing off. Return to ED for worsening symptoms, drainage from site, bleeding from site, fever, signs of severe allergic reaction.

## 2016-08-30 NOTE — ED Provider Notes (Signed)
[facility]-EMERGENCY DEPT Provider Note   CSN: [id number] Arrival date & time: 08/30/16  1713  By signing my name below, I, Marcus Sweeney, attest that this documentation has been prepared under the direction and in the presence of Marcus Goodwine, PA-C.  Electronically Signed: Vista Sweeney, ED Scribe. 08/30/16. 8:04 PM.   History   Chief Complaint Chief Complaint  Patient presents with  . Rash    HPI HPI Comments: Marcus Sweeney is a 41 y.o. male who presents to the Emergency Department complaining of pruritic, painful rash to bilateral thighs that started two weeks ago. He has tried OTC creams without relief; he does not remember which he has tried. Pt does note that he sweats a lot and plays basketball frequently. No new soaps, detergents, fabrics. No fever. No Hx of allergies. Denies chest pain, lip swelling or trouble breathing.   The history is provided by the patient. No language interpreter was used.    History reviewed. No pertinent past medical history.  There are no active problems to display for this patient.   History reviewed. No pertinent surgical history.     Home Medications    Prior to Admission medications   Medication Sig Start Date End Date Taking? Authorizing Provider  amoxicillin-clavulanate (AUGMENTIN) 875-125 MG per tablet Take 1 tablet by mouth 2 (two) times daily. 04/12/13   Vanetta Mulders, MD  HYDROcodone-acetaminophen (NORCO/VICODIN) 5-325 MG per tablet Take 1-2 tablets by mouth every 6 (six) hours as needed for moderate pain. 04/12/13   Vanetta Mulders, MD  methocarbamol (ROBAXIN) 500 MG tablet Take 1 tablet (500 mg total) by mouth 2 (two) times daily. 09/11/15   Sam, Ace Gins, PA-C  naproxen (NAPROSYN) 500 MG tablet Take 1 tablet (500 mg total) by mouth 2 (two) times daily. 09/11/15   Carlene Coria, PA-C    Family History No family history on file.  Social History Social History  Substance Use Topics  . Smoking status: Former Smoker    Types:  Cigarettes    Quit date: 07/19/2012  . Smokeless tobacco: Never Used  . Alcohol use No     Comment: "once a week"     Allergies   Patient has no known allergies.   Review of Systems Review of Systems  Constitutional: Negative for fever.  HENT: Negative for facial swelling.   Respiratory: Negative for shortness of breath.   Gastrointestinal: Negative for nausea and vomiting.  Skin: Positive for rash (bilateral thighs).     Physical Exam Updated Vital Signs BP 126/76 (BP Location: Left Arm)   Pulse 63   Temp 98.7 F (37.1 C) (Oral)   Resp 16   Ht 6\' 1"  (1.854 m)   Wt 104.3 kg (230 lb)   SpO2 99%   BMI 30.34 kg/m   Physical Exam  Constitutional: He appears well-developed and well-nourished. No distress.  HENT:  Head: Normocephalic and atraumatic.  Eyes: Conjunctivae and EOM are normal. No scleral icterus.  Neck: Normal range of motion.  Pulmonary/Chest: Effort normal. No respiratory distress.  Neurological: He is alert.  Skin: Rash noted. He is not diaphoretic.  Hyperpigmented, slightly scaly area on both upper thighs. No overlying cellulitis, temperature change, or TTP. No excoriation or streaking noted.   Psychiatric: He has a normal mood and affect.  Nursing note and vitals reviewed.    ED Treatments / Results  DIAGNOSTIC STUDIES: Oxygen Saturation is 98% on RA, normal by my interpretation.  COORDINATION OF CARE: 8:04 PM-Discussed treatment plan with pt at  bedside and pt agreed to plan.   Labs (all labs ordered are listed, but only abnormal results are displayed) Labs Reviewed - No data to display  EKG  EKG Interpretation None       Radiology No results found.  Procedures Procedures (including critical care time)  Medications Ordered in ED Medications - No data to display   Initial Impression / Assessment and Plan / ED Course  I have reviewed the triage vital signs and the nursing notes.  Pertinent labs & imaging results that were  available during my care of the patient were reviewed by me and considered in my medical decision making (see chart for details).    Patient presents to ED for rash on bilateral upper thighs for the past 2 weeks. He states that despite the use of topical treatments, symptoms have not improved. Patient is unsure of which topical medications he has taken. On physical exam there is a hyperpigmented slightly scaly area on bilateral upper thighs. There is no evidence of overlying cellulitis. Pt has a patent airway without stridor and is handling secretions without difficulty; no angioedema. No blisters, no pustules, no warmth, no draining sinus tracts, no superficial abscesses, no bullous impetigo, no vesicles, no desquamation, no target lesions with dusky purpura or a central bulla. Not tender to touch. No concern for superimposed infection. No concern for SJS, TEN, TSS, tick borne illness, syphilis or other life-threatening condition.  Symptoms likely due to tinea versicolor considering patient reports excessive sweating. We will advise patient to use selenium sulfide shampoo and affected areas and apply once daily for 1 week and wash off after 10-15 minutes. Patient appears stable for discharge at this time. Strict return precautions given.   Final Clinical Impressions(s) / ED Diagnoses   Final diagnoses:  Rash    New Prescriptions Discharge Medication List as of 08/30/2016  8:14 PM    I personally performed the services described in this documentation, which was scribed in my presence. The recorded information has been reviewed and is accurate.     Dietrich PatesKhatri, Payten Beaumier, PA-C 08/30/16 [id number]    Vanetta MuldersZackowski, Scott, MD 09/02/16 [phone]

## 2016-12-19 ENCOUNTER — Emergency Department (HOSPITAL_COMMUNITY): Payer: 59

## 2016-12-19 ENCOUNTER — Encounter (HOSPITAL_COMMUNITY): Payer: Self-pay | Admitting: Emergency Medicine

## 2016-12-19 ENCOUNTER — Emergency Department (HOSPITAL_COMMUNITY)
Admission: EM | Admit: 2016-12-19 | Discharge: 2016-12-19 | Disposition: A | Discharge status: Home / Self Care | Payer: 59 | Attending: Emergency Medicine | Admitting: Emergency Medicine

## 2016-12-19 DIAGNOSIS — Z87891 Personal history of nicotine dependence: Secondary | ICD-10-CM | POA: Diagnosis not present

## 2016-12-19 DIAGNOSIS — Y929 Unspecified place or not applicable: Secondary | ICD-10-CM | POA: Diagnosis not present

## 2016-12-19 DIAGNOSIS — X500XXA Overexertion from strenuous movement or load, initial encounter: Secondary | ICD-10-CM | POA: Insufficient documentation

## 2016-12-19 DIAGNOSIS — S76111A Strain of right quadriceps muscle, fascia and tendon, initial encounter: Secondary | ICD-10-CM | POA: Diagnosis not present

## 2016-12-19 DIAGNOSIS — Z79899 Other long term (current) drug therapy: Secondary | ICD-10-CM | POA: Insufficient documentation

## 2016-12-19 DIAGNOSIS — S79821A Other specified injuries of right thigh, initial encounter: Secondary | ICD-10-CM | POA: Diagnosis present

## 2016-12-19 DIAGNOSIS — T1490XA Injury, unspecified, initial encounter: Secondary | ICD-10-CM

## 2016-12-19 DIAGNOSIS — Y9383 Activity, rough housing and horseplay: Secondary | ICD-10-CM | POA: Diagnosis not present

## 2016-12-19 DIAGNOSIS — Y999 Unspecified external cause status: Secondary | ICD-10-CM | POA: Diagnosis not present

## 2016-12-19 DIAGNOSIS — S76112A Strain of left quadriceps muscle, fascia and tendon, initial encounter: Secondary | ICD-10-CM

## 2016-12-19 MED ORDER — METHOCARBAMOL 500 MG PO TABS: 500.0000 mg | ORAL_TABLET | Freq: Two times a day (BID) | ORAL | 0 refills | Status: DC

## 2016-12-19 NOTE — ED Notes (Signed)
Called to recheck vitals on Pt. No Answer x's 3.

## 2016-12-19 NOTE — ED Notes (Addendum)
Pt verbalized understanding of d/c instructions and has no further questions. Pt is stable, A&Ox4, VSS. Pt unable to sign d/t electronic pad being unavailable  

## 2016-12-19 NOTE — ED Provider Notes (Signed)
[facility] EMERGENCY DEPARTMENT Provider Note   CSN: [id number] Arrival date & time: 12/19/16  1509     History   Chief Complaint Chief Complaint  Patient presents with  . Leg Pain    HPI Marcus Sweeney is a 41 y.o. male.  HPI  41 y.o. male presents to the Emergency Department today due to bilateral thigh pain. States this occurred after running around with his last night. Notes hearing a "pop" in each leg. States pain worse with ambulation. Worse with standing from seated position. No pain at rest. No swelling. Area non TTP. Mainly hurts along anterior aspect of thigh. Rates no pain currently due to sitting. No numbness/tingling. No other symptoms noted   History reviewed. No pertinent past medical history.  There are no active problems to display for this patient.   History reviewed. No pertinent surgical history.     Home Medications    Prior to Admission medications   Medication Sig Start Date End Date Taking? Authorizing Provider  amoxicillin-clavulanate (AUGMENTIN) 875-125 MG per tablet Take 1 tablet by mouth 2 (two) times daily. 04/12/13   Vanetta Mulders, MD  HYDROcodone-acetaminophen (NORCO/VICODIN) 5-325 MG per tablet Take 1-2 tablets by mouth every 6 (six) hours as needed for moderate pain. 04/12/13   Vanetta Mulders, MD  methocarbamol (ROBAXIN) 500 MG tablet Take 1 tablet (500 mg total) by mouth 2 (two) times daily. 09/11/15   Sam, Ace Gins, PA-C  naproxen (NAPROSYN) 500 MG tablet Take 1 tablet (500 mg total) by mouth 2 (two) times daily. 09/11/15   Carlene Coria, PA-C    Family History History reviewed. No pertinent family history.  Social History Social History  Substance Use Topics  . Smoking status: Former Smoker    Types: Cigarettes    Quit date: 07/19/2012  . Smokeless tobacco: Never Used  . Alcohol use No     Comment: "once a week"     Allergies   Patient has no known allergies.   Review of Systems Review of  Systems ROS reviewed and all are negative for acute change except as noted in the HPI.  Physical Exam Updated Vital Signs BP 122/70 (BP Location: Left Arm)   Pulse 69   Temp 98.5 F (36.9 C) (Oral)   Resp 14   Ht 6\' 1"  (1.854 m)   Wt 106.6 kg (235 lb)   SpO2 100%   BMI 31.00 kg/m   Physical Exam  Constitutional: He is oriented to person, place, and time. Vital signs are normal. He appears well-developed and well-nourished.  HENT:  Head: Normocephalic.  Right Ear: Hearing normal.  Left Ear: Hearing normal.  Eyes: Pupils are equal, round, and reactive to light. Conjunctivae and EOM are normal.  Cardiovascular: Normal rate and regular rhythm.   Pulmonary/Chest: Effort normal.  Musculoskeletal:  Bilateral anterior quadriceps non TTP. No swelling. Ambulates well but notes pain with ambulation. NVI. Distal pulses appreciated   Neurological: He is alert and oriented to person, place, and time.  Skin: Skin is warm and dry.  Psychiatric: He has a normal mood and affect. His speech is normal and behavior is normal. Thought content normal.  Nursing note and vitals reviewed.    ED Treatments / Results  Labs (all labs ordered are listed, but only abnormal results are displayed) Labs Reviewed - No data to display  EKG  EKG Interpretation None       Radiology Dg Femur Min 2 Views Left  Result Date: 12/19/2016 CLINICAL  DATA:  Mid femoral pain after running last evening. EXAM: LEFT FEMUR 2 VIEWS COMPARISON:  None. FINDINGS: There is no evidence of fracture or other focal bone lesions. Degenerative spurring off the left acetabulum and lower pole of the patella. Soft tissue ossific along the hamstrings may reflect stigmata of chronic myositis ossificans. IMPRESSION: 1. No acute fracture or suspicious osseous lesions. 2. Mild degenerative spurring about the left acetabulum and lower pole of the patella. 3. 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 Electronically Signed   By: Tollie Ethavid  Kwon M.D.   On: 12/19/2016 16:50   Dg Femur, Min 2 Views Right  Result Date: 12/19/2016 CLINICAL DATA:  Running injury last evening. EXAM: RIGHT FEMUR 2 VIEWS COMPARISON:  None. FINDINGS: The hip and knee joints are maintained. No acute femur fracture. Nutrient vessel noted involving the posterior and medial cortex of the mid femur. 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 IMPRESSION: No acute bony findings. Electronically Signed   By: Rudie MeyerP.  Gallerani M.D.   On: 12/19/2016 16:51    Procedures Procedures (including critical care time)  Medications Ordered in ED Medications - No data to display   Initial Impression / Assessment and Plan / ED Course  I have reviewed the triage vital signs and the nursing notes.  Pertinent labs & imaging results that were available during my care of the patient were reviewed by me and considered in my medical decision making (see chart for details).  Final Clinical Impressions(s) / ED Diagnoses   {I have reviewed and evaluated the relevant imaging studies.  {I have reviewed the relevant previous healthcare records.  {I obtained HPI from historian.   ED Course:  Assessment: Patient X-Ray negative for obvious fracture or dislocation. Likely quadriceps strain. Pt advised to follow up with PCP. Conservative therapy recommended and discussed. Patient will be discharged home & is agreeable with above plan. Returns precautions discussed. Pt appears safe for discharge  Disposition/Plan:  DC Home Additional Verbal discharge instructions given and discussed with patient.  Pt Instructed to f/u with PCP in the next week for evaluation and treatment of symptoms. Return precautions given Pt acknowledges and agrees with plan  Supervising Physician Pricilla LovelessGoldston, Scott, MD  Final diagnoses:  Strain of right quadriceps, initial  encounter  Strain of left quadriceps, initial encounter    New Prescriptions New Prescriptions   No medications on file     Audry PiliMohr, Cambry Spampinato, Cordelia Poche-C 12/19/16 2020    Pricilla LovelessGoldston, Scott, MD 12/19/16 2034

## 2016-12-19 NOTE — Discharge Instructions (Signed)
Please read and follow all provided instructions.  Your diagnoses today include:  1. Strain of right quadriceps, initial encounter   2. Injury   3. Strain of left quadriceps, initial encounter     Tests performed today include: Vital signs. See below for your results today.   Medications prescribed:  Take as prescribed   Home care instructions:  Follow any educational materials contained in this packet.  Follow-up instructions: Please follow-up with your primary care provider for further evaluation of symptoms and treatment   Return instructions:  Please return to the Emergency Department if you do not get better, if you get worse, or new symptoms OR  - Fever (temperature greater than 101.47F)  - Bleeding that does not stop with holding pressure to the area    -Severe pain (please note that you may be more sore the day after your accident)  - Chest Pain  - Difficulty breathing  - Severe nausea or vomiting  - Inability to tolerate food and liquids  - Passing out  - Skin becoming red around your wounds  - Change in mental status (confusion or lethargy)  - New numbness or weakness    Please return if you have any other emergent concerns.  Additional Information:  Your vital signs today were: BP 122/70 (BP Location: Left Arm)    Pulse 69    Temp 98.5 F (36.9 C) (Oral)    Resp 14    Ht 6\' 1"  (1.854 m)    Wt 106.6 kg (235 lb)    SpO2 100%    BMI 31.00 kg/m  If your blood pressure (BP) was elevated above 135/85 this visit, please have this repeated by your doctor within one month. ---------------

## 2016-12-19 NOTE — ED Triage Notes (Signed)
Pt to ER for evaluation of bilateral thigh pain after "running around with the kids" night. States was running and heard a pop in each leg and now has pain with ambulation.

## 2017-11-10 ENCOUNTER — Emergency Department (HOSPITAL_COMMUNITY)
Admission: EM | Admit: 2017-11-10 | Discharge: 2017-11-10 | Disposition: A | Discharge status: Home / Self Care | Payer: 59 | Attending: Emergency Medicine | Admitting: Emergency Medicine

## 2017-11-10 ENCOUNTER — Other Ambulatory Visit: Payer: Self-pay

## 2017-11-10 ENCOUNTER — Encounter (HOSPITAL_COMMUNITY): Payer: Self-pay | Admitting: *Deleted

## 2017-11-10 DIAGNOSIS — S40261A Insect bite (nonvenomous) of right shoulder, initial encounter: Secondary | ICD-10-CM | POA: Insufficient documentation

## 2017-11-10 DIAGNOSIS — R21 Rash and other nonspecific skin eruption: Secondary | ICD-10-CM | POA: Insufficient documentation

## 2017-11-10 DIAGNOSIS — T7840XA Allergy, unspecified, initial encounter: Secondary | ICD-10-CM | POA: Insufficient documentation

## 2017-11-10 DIAGNOSIS — Z0279 Encounter for issue of other medical certificate: Secondary | ICD-10-CM | POA: Insufficient documentation

## 2017-11-10 DIAGNOSIS — Z87891 Personal history of nicotine dependence: Secondary | ICD-10-CM | POA: Insufficient documentation

## 2017-11-10 DIAGNOSIS — Y9389 Activity, other specified: Secondary | ICD-10-CM | POA: Insufficient documentation

## 2017-11-10 DIAGNOSIS — W57XXXA Bitten or stung by nonvenomous insect and other nonvenomous arthropods, initial encounter: Secondary | ICD-10-CM | POA: Insufficient documentation

## 2017-11-10 DIAGNOSIS — Y998 Other external cause status: Secondary | ICD-10-CM | POA: Insufficient documentation

## 2017-11-10 DIAGNOSIS — Y929 Unspecified place or not applicable: Secondary | ICD-10-CM | POA: Insufficient documentation

## 2017-11-10 DIAGNOSIS — S50861A Insect bite (nonvenomous) of right forearm, initial encounter: Secondary | ICD-10-CM | POA: Insufficient documentation

## 2017-11-10 DIAGNOSIS — S40262A Insect bite (nonvenomous) of left shoulder, initial encounter: Secondary | ICD-10-CM | POA: Insufficient documentation

## 2017-11-10 MED ORDER — HYDROCORTISONE 1 % EX CREA: TOPICAL_CREAM | CUTANEOUS | 0 refills | Status: DC

## 2017-11-10 MED ORDER — DIPHENHYDRAMINE HCL 25 MG PO TABS: 25.0000 mg | ORAL_TABLET | Freq: Four times a day (QID) | ORAL | 0 refills | Status: DC

## 2017-11-10 MED ORDER — FAMOTIDINE 20 MG PO TABS: 20.0000 mg | ORAL_TABLET | Freq: Two times a day (BID) | ORAL | 0 refills | Status: DC

## 2017-11-10 NOTE — ED Provider Notes (Signed)
[facility] Provider Note   CSN: [id number] Arrival date & time: 11/10/17  [id number]     History   Chief Complaint Chief Complaint  Patient presents with  . Rash    HPI Marcus Sweeney is a 42 y.o. male.  HPI Patient is a 42 year old male presents the emergency department with reported possible insect bite with associated allergic reaction to his bilateral shoulder and his left forearm.  He states it itches.  He tried Benadryl last night with some improvement.  No other significant complaints.  Symptoms are mild in severity at this time.  He missed work yesterday is requesting a work note.  History reviewed. No pertinent past medical history.  There are no active problems to display for this patient.   History reviewed. No pertinent surgical history.      Home Medications    Prior to Admission medications   Medication Sig Start Date End Date Taking? Authorizing Provider  diphenhydrAMINE (BENADRYL) 25 MG tablet Take 1 tablet (25 mg total) by mouth every 6 (six) hours. 11/10/17   Azalia Bilisampos, Armeda Plumb, MD  famotidine (PEPCID) 20 MG tablet Take 1 tablet (20 mg total) by mouth 2 (two) times daily. 11/10/17   Azalia Bilisampos, Anastassia Noack, MD  hydrocortisone cream 1 % Apply to affected area 4 times daily as needed 11/10/17   Azalia Bilisampos, Theodis Kinsel, MD    Family History History reviewed. No pertinent family history.  Social History Social History   Tobacco Use  . Smoking status: Former Smoker    Types: Cigarettes    Last attempt to quit: 07/19/2012    Years since quitting: 5.3  . Smokeless tobacco: Never Used  Substance Use Topics  . Alcohol use: No    Comment: "once a week"  . Drug use: No     Allergies   Patient has no known allergies.   Review of Systems Review of Systems  All other systems reviewed and are negative.    Physical Exam Updated Vital Signs BP 130/86 (BP Location: Right Arm)   Pulse 77   Temp 98 F (36.7 C) (Oral)   Resp 18   SpO2  98%   Physical Exam  Constitutional: He is oriented to person, place, and time. He appears well-developed and well-nourished.  HENT:  Head: Normocephalic.  Eyes: EOM are normal.  Neck: Normal range of motion.  Pulmonary/Chest: Effort normal.  Abdominal: He exhibits no distension.  Musculoskeletal: Normal range of motion.  Neurological: He is alert and oriented to person, place, and time.  Skin: Skin is warm.  Localized allergic reaction to his bilateral lateral shoulders as well as his left posterior proximal forearm without surrounding signs of infection.  Mild warmth and erythema consistent with localized allergic reaction  Psychiatric: He has a normal mood and affect.  Nursing note and vitals reviewed.    ED Treatments / Results  Labs (all labs ordered are listed, but only abnormal results are displayed) Labs Reviewed - No data to display  EKG None  Radiology No results found.  Procedures Procedures (including critical care time)  Medications Ordered in ED Medications - No data to display   Initial Impression / Assessment and Plan / ED Course  I have reviewed the triage vital signs and the nursing notes.  Pertinent labs & imaging results that were available during my care of the patient were reviewed by me and considered in my medical decision making (see chart for details).     Last allergic reaction likely secondary  to insect bite.  No systemic symptoms.  No signs of infection.  Home with standard medications including hydrocortisone, Benadryl, Pepcid.   Final Clinical Impressions(s) / ED Diagnoses   Final diagnoses:  Allergic reaction, initial encounter    ED Discharge Orders         Ordered    hydrocortisone cream 1 %     11/10/17 0836    diphenhydrAMINE (BENADRYL) 25 MG tablet  Every 6 hours     11/10/17 0836    famotidine (PEPCID) 20 MG tablet  2 times daily     11/10/17 0836           Azalia Bilis, MD 11/10/17 [phone]

## 2017-11-10 NOTE — ED Notes (Signed)
Pt discharged from ED; instructions provided and scripts given; Pt encouraged to return to ED if symptoms worsen and to f/u with PCP; Pt verbalized understanding of all instructions 

## 2017-11-10 NOTE — ED Triage Notes (Signed)
Pt in c/o rash to his left arm that he noted yesterday, no distress noted

## 2017-12-02 ENCOUNTER — Encounter (HOSPITAL_COMMUNITY): Payer: Self-pay | Admitting: Emergency Medicine

## 2017-12-02 ENCOUNTER — Emergency Department (HOSPITAL_COMMUNITY)
Admission: EM | Admit: 2017-12-02 | Discharge: 2017-12-03 | Disposition: A | Discharge status: Home / Self Care | Payer: 59 | Attending: Emergency Medicine | Admitting: Emergency Medicine

## 2017-12-02 DIAGNOSIS — R5383 Other fatigue: Secondary | ICD-10-CM

## 2017-12-02 DIAGNOSIS — R42 Dizziness and giddiness: Secondary | ICD-10-CM | POA: Insufficient documentation

## 2017-12-02 DIAGNOSIS — Z87891 Personal history of nicotine dependence: Secondary | ICD-10-CM | POA: Insufficient documentation

## 2017-12-02 DIAGNOSIS — Z79899 Other long term (current) drug therapy: Secondary | ICD-10-CM | POA: Insufficient documentation

## 2017-12-02 DIAGNOSIS — R0602 Shortness of breath: Secondary | ICD-10-CM | POA: Insufficient documentation

## 2017-12-02 DIAGNOSIS — R531 Weakness: Secondary | ICD-10-CM | POA: Insufficient documentation

## 2017-12-02 LAB — CBC
HEMATOCRIT: 44.7 % (ref 39.0–52.0)
HEMOGLOBIN: 14.7 g/dL (ref 13.0–17.0)
MCH: 32.1 pg (ref 26.0–34.0)
MCHC: 32.9 g/dL (ref 30.0–36.0)
MCV: 97.6 fL (ref 80.0–100.0)
NRBC: 0 % (ref 0.0–0.2)
Platelets: 230 10*3/uL (ref 150–400)
RBC: 4.58 MIL/uL (ref 4.22–5.81)
RDW: 12.9 % (ref 11.5–15.5)
WBC: 8.5 10*3/uL (ref 4.0–10.5)

## 2017-12-02 LAB — BASIC METABOLIC PANEL
Anion gap: 6 (ref 5–15)
BUN: 14 mg/dL (ref 6–20)
CHLORIDE: 109 mmol/L (ref 98–111)
CO2: 25 mmol/L (ref 22–32)
Calcium: 8.7 mg/dL — ABNORMAL LOW (ref 8.9–10.3)
Creatinine, Ser: 1.21 mg/dL (ref 0.61–1.24)
GFR calc non Af Amer: >60 mL/min (ref >60)
Glucose, Bld: 111 mg/dL — ABNORMAL HIGH (ref 70–99)
POTASSIUM: 4 mmol/L (ref 3.5–5.1)
SODIUM: 140 mmol/L (ref 135–145)

## 2017-12-02 LAB — URINALYSIS, ROUTINE W REFLEX MICROSCOPIC
Bilirubin Urine: NEGATIVE
Glucose, UA: NEGATIVE mg/dL
Hgb urine dipstick: NEGATIVE
KETONES UR: NEGATIVE mg/dL
LEUKOCYTES UA: NEGATIVE
NITRITE: NEGATIVE
PH: 6 (ref 5.0–8.0)
Protein, ur: NEGATIVE mg/dL
Specific Gravity, Urine: 1.001 — ABNORMAL LOW (ref 1.005–1.030)

## 2017-12-02 NOTE — ED Provider Notes (Signed)
[facility] EMERGENCY DEPARTMENT Provider Note   CSN: [id number] Arrival date & time: 12/02/17  1851     History   Chief Complaint Chief Complaint  Patient presents with  . Dizziness  . Weakness  . Shortness of Breath    HPI Marcus Sweeney is a 42 y.o. male.   42 year old male with no significant past medical history presents to the emergency department for evaluation of fatigue.  States that he has been experiencing fatigue intermittently over the past few days.  States that he has felt lightheaded at times; suspects he may be dehydrated.  States that he has continued to drink plenty of fluids and get sleep at night.  Denies increased situational stressors.  No fevers or associated chest pain, syncope, headaches, extremity numbness or paresthesias, unilateral weakness.  He states that he has been feeling better since arrival in the emergency department.  No medications taken prior to arrival for symptoms.  The history is provided by the patient. No language interpreter was used.  Dizziness  Associated symptoms: shortness of breath and weakness   Weakness  Primary symptoms include dizziness. Associated symptoms include shortness of breath.  Shortness of Breath     History reviewed. No pertinent past medical history.  There are no active problems to display for this patient.   History reviewed. No pertinent surgical history.      Home Medications    Prior to Admission medications   Medication Sig Start Date End Date Taking? Authorizing Provider  diphenhydrAMINE (BENADRYL) 25 MG tablet Take 1 tablet (25 mg total) by mouth every 6 (six) hours. 11/10/17   Azalia Bilis, MD  famotidine (PEPCID) 20 MG tablet Take 1 tablet (20 mg total) by mouth 2 (two) times daily. 11/10/17   Azalia Bilis, MD  hydrocortisone cream 1 % Apply to affected area 4 times daily as needed 11/10/17   Azalia Bilis, MD    Family History History reviewed. No pertinent family  history.  Social History Social History   Tobacco Use  . Smoking status: Former Smoker    Types: Cigarettes    Last attempt to quit: 07/19/2012    Years since quitting: 5.3  . Smokeless tobacco: Never Used  Substance Use Topics  . Alcohol use: No    Comment: "once a week"  . Drug use: No     Allergies   Patient has no known allergies.   Review of Systems Review of Systems  Respiratory: Positive for shortness of breath.   Neurological: Positive for dizziness and weakness.  Ten systems reviewed and are negative for acute change, except as noted in the HPI.    Physical Exam Updated Vital Signs BP 116/75   Pulse 60   Temp 98.7 F (37.1 C) (Oral)   Resp 16   Ht 6' (1.829 m)   Wt 90.7 kg   SpO2 96%   BMI 27.12 kg/m   Physical Exam  Constitutional: He is oriented to person, place, and time. He appears well-developed and well-nourished. No distress.  Nontoxic appearing and in NAD  HENT:  Head: Normocephalic and atraumatic.  Eyes: Conjunctivae and EOM are normal. No scleral icterus.  Neck: Normal range of motion.  Cardiovascular: Normal rate, regular rhythm and intact distal pulses.  Pulmonary/Chest: Effort normal. No stridor. No respiratory distress. He has no wheezes. He has no rales.  Respirations even and unlabored. Lungs CTAB.  Abdominal: Soft. He exhibits no distension and no mass. There is no tenderness. There is no guarding.  Soft, nontender.  Musculoskeletal: Normal range of motion.  Neurological: He is alert and oriented to person, place, and time. He exhibits normal muscle tone. Coordination normal.  Skin: Skin is warm and dry. No rash noted. He is not diaphoretic. No erythema. No pallor.  Psychiatric: He has a normal mood and affect. His behavior is normal.  Nursing note and vitals reviewed.    ED Treatments / Results  Labs (all labs ordered are listed, but only abnormal results are displayed) Labs Reviewed  BASIC METABOLIC PANEL - Abnormal; Notable  for the following components:      Result Value   Glucose, Bld 111 (*)    Calcium 8.7 (*)    All other components within normal limits  URINALYSIS, ROUTINE W REFLEX MICROSCOPIC - Abnormal; Notable for the following components:   Color, Urine COLORLESS (*)    Specific Gravity, Urine 1.001 (*)    All other components within normal limits  CBC    EKG EKG Interpretation  Date/Time:  Saturday December 02 2017 19:24:36 EDT Ventricular Rate:  69 PR Interval:  166 QRS Duration: 92 QT Interval:  376 QTC Calculation: 402 R Axis:   63 Text Interpretation:  Normal sinus rhythm Nonspecific ST and T wave abnormality Abnormal ECG Confirmed by Lorre Nick ([id number]) on 12/02/2017 10:41:23 PM   Radiology No results found.  Procedures Procedures (including critical care time)  Medications Ordered in ED Medications - No data to display   Initial Impression / Assessment and Plan / ED Course  I have reviewed the triage vital signs and the nursing notes.  Pertinent labs & imaging results that were available during my care of the patient were reviewed by me and considered in my medical decision making (see chart for details).     42 year old male presenting for increased fatigue and malaise.  Notes intermittent lightheadedness.  Contributes symptoms to dehydration, but has been eating and drinking normally.  States that he gets regular sleep at night.  Is currently asymptomatic with no additional medical complaints.  Vital signs have been stable.  Physical exam reassuring.  Lungs clear bilaterally.  Laboratory work-up without leukocytosis or electrolyte derangements.  Kidney function preserved.  Patient states that he does not desire further work-up.  Expresses comfort with discharge as well as outpatient follow-up.  I do not believe this is unreasonable, though patient has been instructed to return for new or concerning symptoms.  Patient discharged in stable condition with no unaddressed  concerns.   Final Clinical Impressions(s) / ED Diagnoses   Final diagnoses:  Fatigue, unspecified type    ED Discharge Orders    None       Antony Madura, PA-C 12/03/17 0005    Shon Baton, MD 12/04/17 0130

## 2017-12-02 NOTE — Discharge Instructions (Signed)
Your work-up in the emergency department was reassuring.  We recommend that you continue drinking plenty of fluids to prevent dehydration.  Continue eating regular meals.  Try to get 6 to 8 hours of sleep per night.  Follow-up with a primary care doctor to ensure resolution of symptoms.

## 2017-12-02 NOTE — ED Triage Notes (Signed)
Pt presents with dizziness with position changes, generalized weakness that is intermittent, and sob that is intermittent; pt states he suspects he is dehydrated, seen at [facility] ED previously and felt better but then attempted to go to work and didn't feel better; pt denies CP, headache or any other focal deficits currently

## 2018-01-02 ENCOUNTER — Emergency Department (HOSPITAL_COMMUNITY)
Admission: EM | Admit: 2018-01-02 | Discharge: 2018-01-03 | Disposition: A | Discharge status: Home / Self Care | Payer: 59 | Attending: Emergency Medicine | Admitting: Emergency Medicine

## 2018-01-02 ENCOUNTER — Other Ambulatory Visit: Payer: Self-pay

## 2018-01-02 DIAGNOSIS — Z79899 Other long term (current) drug therapy: Secondary | ICD-10-CM | POA: Insufficient documentation

## 2018-01-02 DIAGNOSIS — R0789 Other chest pain: Secondary | ICD-10-CM | POA: Insufficient documentation

## 2018-01-02 DIAGNOSIS — Z87891 Personal history of nicotine dependence: Secondary | ICD-10-CM | POA: Insufficient documentation

## 2018-01-02 MED ORDER — KETOROLAC TROMETHAMINE 15 MG/ML IJ SOLN
15.0000 mg | Freq: Once | INTRAMUSCULAR | Status: AC
  Administered 2018-01-02: 15 mg via INTRAVENOUS
  Filled 2018-01-02: qty 1

## 2018-01-02 NOTE — ED Provider Notes (Signed)
[facility] EMERGENCY DEPARTMENT Provider Note   CSN: [id number] Arrival date & time: 01/02/18  2314     History   Chief Complaint Chief Complaint  Patient presents with  . Chest Pain    HPI Marcus Sweeney is a 42 y.o. male.  HPI  This is a 42 year old male who presents with chest pain.  Patient reports 1 hour history of right-sided chest pain that is sharp and radiates at times to his back.  He states that he is having some numbness in his right hand.  It is worse with breathing.  He reports that he was moving something heavy at work when pain started.  Currently he rates his pain at 9 out of 10.  Nothing seems to make it better or worse.  He is a smoker.  Denies any recent fevers or cough.  No past medical history on file.  There are no active problems to display for this patient.   No past surgical history on file.      Home Medications    Prior to Admission medications   Medication Sig Start Date End Date Taking? Authorizing Provider  diphenhydrAMINE (BENADRYL) 25 MG tablet Take 1 tablet (25 mg total) by mouth every 6 (six) hours. 11/10/17   Azalia Bilis, MD  famotidine (PEPCID) 20 MG tablet Take 1 tablet (20 mg total) by mouth 2 (two) times daily. 11/10/17   Azalia Bilis, MD  hydrocortisone cream 1 % Apply to affected area 4 times daily as needed 11/10/17   Azalia Bilis, MD  naproxen (NAPROSYN) 500 MG tablet Take 1 tablet (500 mg total) by mouth 2 (two) times daily. 01/03/18   Sayge Brienza, Mayer Masker, MD    Family History No family history on file.  Social History Social History   Tobacco Use  . Smoking status: Former Smoker    Types: Cigarettes    Last attempt to quit: 07/19/2012    Years since quitting: 5.4  . Smokeless tobacco: Never Used  Substance Use Topics  . Alcohol use: No    Comment: "once a week"  . Drug use: No     Allergies   Patient has no known allergies.   Review of Systems Review of Systems  Constitutional:  Negative for fever.  Respiratory: Negative for cough and shortness of breath.   Cardiovascular: Positive for chest pain. Negative for leg swelling.  Gastrointestinal: Negative for abdominal pain, nausea and vomiting.  Genitourinary: Negative for dysuria.  All other systems reviewed and are negative.    Physical Exam Updated Vital Signs BP 103/62   Pulse 70   Temp 97.8 F (36.6 C) (Oral)   Resp 20   Wt 91 kg   SpO2 100%   BMI 27.21 kg/m   Physical Exam  Constitutional: He is oriented to person, place, and time. He appears well-developed and well-nourished.  HENT:  Head: Normocephalic and atraumatic.  Eyes: Pupils are equal, round, and reactive to light.  Neck: Neck supple.  Cardiovascular: Normal rate, regular rhythm, normal heart sounds and normal pulses.  No murmur heard. Tenderness palpation right chest wall, no crepitus  Pulmonary/Chest: Effort normal and breath sounds normal. No respiratory distress. He has no wheezes.  Diminished breath sounds in all lung fields  Abdominal: Soft. Bowel sounds are normal. There is no tenderness. There is no rebound.  Musculoskeletal: He exhibits no edema.       Right lower leg: He exhibits no tenderness and no edema.  Left lower leg: He exhibits no tenderness and no edema.  Lymphadenopathy:    He has no cervical adenopathy.  Neurological: He is alert and oriented to person, place, and time.  Skin: Skin is warm and dry.  Psychiatric: He has a normal mood and affect.  Nursing note and vitals reviewed.    ED Treatments / Results  Labs (all labs ordered are listed, but only abnormal results are displayed) Labs Reviewed  BASIC METABOLIC PANEL - Abnormal; Notable for the following components:      Result Value   Calcium 8.6 (*)    All other components within normal limits  CBC WITH DIFFERENTIAL/PLATELET  D-DIMER, QUANTITATIVE (NOT AT [facility])  I-STAT TROPONIN, ED  I-STAT TROPONIN, ED    EKG EKG  Interpretation  Date/Time:  Tuesday January 02 2018 23:21:27 EST Ventricular Rate:  62 PR Interval:    QRS Duration: 90 QT Interval:  383 QTC Calculation: 389 R Axis:   62 Text Interpretation:  Sinus rhythm RSR' in V1 or V2, probably normal variant ST elev, probable normal early repol pattern No significant change since last tracing Confirmed by Ross Marcus ([id number]) on 01/02/2018 11:47:46 PM   Radiology Dg Chest 2 View  Result Date: 01/03/2018 CLINICAL DATA:  Acute onset of right-sided chest pain. EXAM: CHEST - 2 VIEW COMPARISON:  None. FINDINGS: The lungs are well-aerated and clear. There is no evidence of focal opacification, pleural effusion or pneumothorax. The heart is normal in size; the mediastinal contour is within normal limits. No acute osseous abnormalities are seen. IMPRESSION: No acute cardiopulmonary process seen. Electronically Signed   By: Roanna Raider M.D.   On: 01/03/2018 00:16    Procedures Procedures (including critical care time)  Medications Ordered in ED Medications  ketorolac (TORADOL) 15 MG/ML injection 15 mg (15 mg Intravenous Given 01/02/18 2348)     Initial Impression / Assessment and Plan / ED Course  I have reviewed the triage vital signs and the nursing notes.  Pertinent labs & imaging results that were available during my care of the patient were reviewed by me and considered in my medical decision making (see chart for details).     Patient presents with chest pain.  Very atypical in nature.  There is a reproducible component on exam.  He is nontoxic-appearing and vital signs are reassuring.  EKG shows likely early repolarization and is unchanged from prior.  Initially he was activated as a STEMI EMS.  This was canceled and cardiology fellow did evaluate the patient.  She feels that this is likely musculoskeletal in nature given reproducible pain.  Troponin is negative.  Chest x-ray shows no evidence of pneumothorax and pneumonia.  Patient  had improvement of symptoms with Toradol.  Repeat troponin remains negative.  Doubt ACS.  Doubt PE.  Suspect musculoskeletal pain.  Naproxen as needed for pain.  After history, exam, and medical workup I feel the patient has been appropriately medically screened and is safe for discharge home. Pertinent diagnoses were discussed with the patient. Patient was given return precautions.   Final Clinical Impressions(s) / ED Diagnoses   Final diagnoses:  Atypical chest pain    ED Discharge Orders         Ordered    naproxen (NAPROSYN) 500 MG tablet  2 times daily     01/03/18 0338           Camey Edell, Mayer Masker, MD 01/03/18 [phone]

## 2018-01-02 NOTE — ED Triage Notes (Addendum)
States that he was pushing an item at work, when he began to have CP that radiated to back. Per EMS, pain with palpation to chest. Originally encoded as STEMI. Patient was given 324 asa and Nitro x2 en route.

## 2018-01-03 ENCOUNTER — Emergency Department (HOSPITAL_COMMUNITY): Payer: 59

## 2018-01-03 LAB — CBC WITH DIFFERENTIAL/PLATELET
Abs Immature Granulocytes: 0.03 10*3/uL (ref 0.00–0.07)
BASOS PCT: 1 %
Basophils Absolute: 0.1 10*3/uL (ref 0.0–0.1)
EOS ABS: 0.2 10*3/uL (ref 0.0–0.5)
EOS PCT: 2 %
HCT: 41.7 % (ref 39.0–52.0)
Hemoglobin: 13.6 g/dL (ref 13.0–17.0)
IMMATURE GRANULOCYTES: 0 %
Lymphocytes Relative: 21 %
Lymphs Abs: 1.8 10*3/uL (ref 0.7–4.0)
MCH: 31.7 pg (ref 26.0–34.0)
MCHC: 32.6 g/dL (ref 30.0–36.0)
MCV: 97.2 fL (ref 80.0–100.0)
Monocytes Absolute: 0.8 10*3/uL (ref 0.1–1.0)
Monocytes Relative: 10 %
NEUTROS PCT: 66 %
Neutro Abs: 5.7 10*3/uL (ref 1.7–7.7)
PLATELETS: 198 10*3/uL (ref 150–400)
RBC: 4.29 MIL/uL (ref 4.22–5.81)
RDW: 12.8 % (ref 11.5–15.5)
WBC: 8.7 10*3/uL (ref 4.0–10.5)
nRBC: 0 % (ref 0.0–0.2)

## 2018-01-03 LAB — I-STAT TROPONIN, ED
Troponin i, poc: 0 ng/mL (ref 0.00–0.08)
Troponin i, poc: 0 ng/mL (ref 0.00–0.08)

## 2018-01-03 LAB — BASIC METABOLIC PANEL
ANION GAP: 5 (ref 5–15)
BUN: 15 mg/dL (ref 6–20)
CALCIUM: 8.6 mg/dL — AB (ref 8.9–10.3)
CO2: 25 mmol/L (ref 22–32)
Chloride: 110 mmol/L (ref 98–111)
Creatinine, Ser: 0.98 mg/dL (ref 0.61–1.24)
GLUCOSE: 98 mg/dL (ref 70–99)
Potassium: 4 mmol/L (ref 3.5–5.1)
Sodium: 140 mmol/L (ref 135–145)

## 2018-01-03 LAB — D-DIMER, QUANTITATIVE: D-Dimer, Quant: <0.27 ug/mL-FEU (ref 0.00–0.50)

## 2018-01-03 MED ORDER — NAPROXEN 500 MG PO TABS: 500.0000 mg | ORAL_TABLET | Freq: Two times a day (BID) | ORAL | 0 refills | Status: DC

## 2018-01-03 NOTE — Discharge Instructions (Addendum)
You were seen today for chest pain.  Your work-up is negative.  Take naproxen as needed for pain.

## 2018-01-18 ENCOUNTER — Encounter (HOSPITAL_COMMUNITY): Payer: Self-pay | Admitting: Emergency Medicine

## 2018-01-18 ENCOUNTER — Emergency Department (HOSPITAL_COMMUNITY)
Admission: EM | Admit: 2018-01-18 | Discharge: 2018-01-18 | Disposition: A | Discharge status: Home / Self Care | Payer: Self-pay | Attending: Emergency Medicine | Admitting: Emergency Medicine

## 2018-01-18 ENCOUNTER — Other Ambulatory Visit: Payer: Self-pay

## 2018-01-18 DIAGNOSIS — Z79899 Other long term (current) drug therapy: Secondary | ICD-10-CM | POA: Insufficient documentation

## 2018-01-18 DIAGNOSIS — J019 Acute sinusitis, unspecified: Secondary | ICD-10-CM | POA: Insufficient documentation

## 2018-01-18 DIAGNOSIS — B9689 Other specified bacterial agents as the cause of diseases classified elsewhere: Secondary | ICD-10-CM

## 2018-01-18 DIAGNOSIS — Z87891 Personal history of nicotine dependence: Secondary | ICD-10-CM | POA: Insufficient documentation

## 2018-01-18 LAB — GROUP A STREP BY PCR: Group A Strep by PCR: NOT DETECTED

## 2018-01-18 MED ORDER — PROMETHAZINE-DM 6.25-15 MG/5ML PO SYRP: 5.0000 mL | ORAL_SOLUTION | Freq: Four times a day (QID) | ORAL | 0 refills | Status: DC | PRN

## 2018-01-18 MED ORDER — AMOXICILLIN-POT CLAVULANATE 875-125 MG PO TABS: 1.0000 | ORAL_TABLET | Freq: Two times a day (BID) | ORAL | 0 refills | Status: AC

## 2018-01-18 MED ORDER — GUAIFENESIN 100 MG/5ML PO LIQD: 100.0000 mg | ORAL | 0 refills | Status: DC | PRN

## 2018-01-18 MED ORDER — ACETAMINOPHEN 500 MG PO TABS
1000.0000 mg | ORAL_TABLET | Freq: Once | ORAL | Status: AC
  Administered 2018-01-18: 1000 mg via ORAL
  Filled 2018-01-18: qty 2

## 2018-01-18 MED ORDER — AMOXICILLIN-POT CLAVULANATE 875-125 MG PO TABS
1.0000 | ORAL_TABLET | Freq: Once | ORAL | Status: AC
  Administered 2018-01-18: 1 via ORAL
  Filled 2018-01-18: qty 1

## 2018-01-18 MED ORDER — AMOXICILLIN-POT CLAVULANATE 875-125 MG PO TABS: 1.0000 | ORAL_TABLET | Freq: Two times a day (BID) | ORAL | 0 refills | Status: DC

## 2018-01-18 NOTE — ED Notes (Signed)
ED Provider at bedside. 

## 2018-01-18 NOTE — ED Triage Notes (Signed)
Pt states he was seen at urgent care yesterday and stated he tested positive for strept throat. Pt said he was not given any medication because he had to leave to go to work.

## 2018-01-18 NOTE — Discharge Instructions (Signed)
Thank you for allowing me to care for you today in the Emergency Department.   Your symptoms are consistent with bacterial sinusitis.  Take 1 tablet of Augmentin in the morning and 1 tablet at night for the next 10 days.  I have attached instructions on how to perform a sinus rinse to help improve your nasal congestion.  It is important that you complete the entire course of antibiotics so do not stop taking this even if your symptoms seem to improve because it puts you at risk for bacterial resistance in the future for infections.  Take 600 mg of ibuprofen with food every 6 hours or 650 mg of Tylenol every 6 hours to help with headache, pain, and fever.  You can also alternate between these 2 medications every 3 hours.  Take 5 mL's of promethazine dextromethorphan every 6 hours as nasal congestion and 5 to 10 mL's of guaifenesin every 6 hours as needed to help break up your cough and congestion.  Return to the emergency department if you develop high fevers of greater than 100.5 despite taking ibuprofen and Tylenol, pain with movement of your eyes, significant pain, redness, and swelling around your eyes, shortness of breath, or other new, concerning symptoms.

## 2018-01-18 NOTE — ED Notes (Signed)
Patient verbalizes understanding of discharge instructions. Opportunity for questioning and answers were provided. Armband removed by staff, pt discharged from ED ambulatory.   

## 2018-01-19 NOTE — ED Provider Notes (Signed)
[facility]ER[facility]ENCY DEPARTMENT Provider Note   CSN: [id number] Arrival date & time: 01/18/18  2101     History   Chief Complaint Chief Complaint  Patient presents with  . Sore Throat    HPI Marcus S[facility]ey is a 42 y.o. male with no pertinent past medical history who presents to the emergency department with a chief complaint of URI symptoms.   The patient endorses left forehead sinus pain and pressure, headache, nasal congestion, nonproductive cough, sneezing, objective fever, chills, bilateral otalgia, sore throat that began more than a week ago.  He states the symptoms have been constant, and gradually worsening since onset.  No known aggravating or alleviating factors.  No treatment for symptoms prior to arrival.  He denies neck pain or stiffness, rash, dyspnea, chest pain, abdominal pain, dizziness, lightheadedness, numbness, weakness, or gait instability, nausea, vomiting, diarrhea, myalgias, or visual changes.  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  He states that he went to an urgent care in [facility] yesterday, but cannot recall the name, and he was tested for strep throat and it was positive.  He states that he had to leave to return to work prior to getting treatment for strep throat and is requesting treatment today.  He states that he has not had a flu shot since early last year.  He is a former smoker.  The history is provided by the patient. No language interpreter was used.    History reviewed. No pertinent past medical history.  There are no active problems to display for this patient.   History reviewed. No pertinent surgical history.      Home Medications    Prior to Admission medications   Medication Sig Start Date End Date Taking? Authorizing Provider  amoxicillin-clavulanate (AUGMENTIN) 875-125 MG tablet Take 1 tablet by mouth every 12 (twelve) hours for 10 days. 01/18/18  01/28/18  McDonald, Mia A, PA-C  diphenhydrAMINE (BENADRYL) 25 MG tablet Take 1 tablet (25 mg total) by mouth every 6 (six) hours. 11/10/17   Azalia Bilis, MD  famotidine (PEPCID) 20 MG tablet Take 1 tablet (20 mg total) by mouth 2 (two) times daily. 11/10/17   Azalia Bilis, MD  guaiFENesin (ROBITUSSIN) 100 MG/5ML liquid Take 5-10 mLs (100-200 mg total) by mouth every 4 (four) hours as needed for cough. 01/18/18   McDonald, Mia A, PA-C  hydrocortisone cream 1 % Apply to affected area 4 times daily as needed 11/10/17   Azalia Bilis, MD  naproxen (NAPROSYN) 500 MG tablet Take 1 tablet (500 mg total) by mouth 2 (two) times daily. 01/03/18   Horton, Mayer Masker, MD  promethazine-dextromethorphan (PROMETHAZINE-DM) 6.25-15 MG/5ML syrup Take 5 mLs by mouth 4 (four) times daily as needed for cough. 01/18/18   McDonald, Mia A, PA-C    Family History No family history on file.  Social History Social History   Tobacco Use  . Smoking status: Former Smoker    Types: Cigarettes    Last attempt to quit: 07/19/2012    Years since quitting: 5.5  . Smokeless tobacco: Never Used  Substance Use Topics  . Alcohol use: No    Comment: "once a week"  . Drug use: No     Allergies   Patient has no known allergies.   Review of Systems Review of Systems  Constitutional: Negative for appetite change and fever.  HENT: Positive for congestion, ear pain, rhinorrhea, sinus pressure, sinus pain,  sneezing and sore throat. Negative for facial swelling, hearing loss, nosebleeds and trouble swallowing.   Eyes: Negative for visual disturbance.  Respiratory: Positive for cough. Negative for shortness of breath and wheezing.   Cardiovascular: Negative for chest pain, palpitations and leg swelling.  Gastrointestinal: Negative for abdominal pain, diarrhea, nausea and vomiting.  Genitourinary: Negative for dysuria.  Musculoskeletal: Negative for back pain, myalgias, neck pain and neck stiffness.  Skin: Negative for rash.   Allergic/Immunologic: Negative for immunocompromised state.  Neurological: Positive for headaches. Negative for dizziness and seizures.  Psychiatric/Behavioral: Negative for confusion.     Physical Exam Updated Vital Signs BP (!) 108/59   Pulse 87   Temp 99.1 F (37.3 C) (Oral)   Resp 18   SpO2 99%   Physical Exam  Constitutional: He appears well-developed.  HENT:  Head: Normocephalic.  Right Ear: Hearing, external ear and ear canal normal. No mastoid tenderness. Tympanic membrane is bulging.  Left Ear: Hearing, external ear and ear canal normal. No mastoid tenderness. Tympanic membrane is not erythematous and not bulging.  Nose: Mucosal edema and rhinorrhea present. Right sinus exhibits no maxillary sinus tenderness and no frontal sinus tenderness. Left sinus exhibits frontal sinus tenderness. Left sinus exhibits no maxillary sinus tenderness.  Mouth/Throat: Uvula is midline and mucous membranes are normal. No oral lesions. No trismus in the jaw. No dental abscesses or uvula swelling. Posterior oropharyngeal erythema present. No oropharyngeal exudate, posterior oropharyngeal edema or tonsillar abscesses. No tonsillar exudate.  Eyes: Pupils are equal, round, and reactive to light. Conjunctivae and EOM are normal. Right eye exhibits no discharge. Left eye exhibits no discharge.  Neck: Normal range of motion. Neck supple. No thyromegaly present.  Cardiovascular: Normal rate, regular rhythm, normal heart sounds and intact distal pulses. Exam reveals no gallop and no friction rub.  No murmur heard. Pulmonary/Chest: Effort normal and breath sounds normal. No stridor. No respiratory distress. He has no wheezes. He has no rales. He exhibits no tenderness.  Abdominal: Soft. He exhibits no distension. There is no tenderness. There is no guarding.  Lymphadenopathy:    He has no cervical adenopathy.  Neurological: He is alert.  Skin: Skin is warm and dry.  Psychiatric: His behavior is normal.   Nursing note and vitals reviewed.    ED Treatments / Results  Labs (all labs ordered are listed, but only abnormal results are displayed) Labs Reviewed  GROUP A STREP BY PCR    EKG None  Radiology No results found.  Procedures Procedures (including critical care time)  Medications Ordered in ED Medications  acetaminophen (TYLENOL) tablet 1,000 mg (1,000 mg Oral Given 01/18/18 2227)  amoxicillin-clavulanate (AUGMENTIN) 875-125 MG per tablet 1 tablet (1 tablet Oral Given 01/18/18 2317)     Initial Impression / Assessment and Plan / ED Course  I have reviewed the triage vital signs and the nursing notes.  Pertinent labs & imaging results that were available during my care of the patient were reviewed by me and considered in my medical decision making (see chart for details).     42 year old male with no pertinent past medical history presenting with URI symptoms for greater than a week.  His main complaint is his headache, which is localized over his left frontal sinus and nasal congestion.  He also states that he was [place] [place] [facility] and tested positive for strep throat, left before receiving the treatment.  No record of this visit is available in care everywhere or epic.  Will repeat strep PCR today.  Vital signs are reassuring without tachycardia or fever.  On exam, he is tender to palpation over the left frontal sinus and his right TM is also bulging.  Lungs are clear to auscultation bilaterally.  Doubt pneumonia and influenza.  Strep PCR is negative.  Given the patient's length of symptoms for almost 10 days and complaints of sinusitis, will treat for acute bacterial sinusitis with Augmentin and discharge the patient with prescriptions for symptomatic treatment.  Encourage the patient to get established with a primary care provider.  He is hemodynamically stable and in no acute distress.  He is safe for discharge to home with outpatient  follow-up at this time.  Final Clinical Impressions(s) / ED Diagnoses   Final diagnoses:  Acute bacterial sinusitis    ED Discharge Orders         Ordered    amoxicillin-clavulanate (AUGMENTIN) 875-125 MG tablet  Every 12 hours,   Status:  Discontinued     01/18/18 2303    promethazine-dextromethorphan (PROMETHAZINE-DM) 6.25-15 MG/5ML syrup  4 times daily PRN,   Status:  Discontinued     01/18/18 2303    guaiFENesin (ROBITUSSIN) 100 MG/5ML liquid  Every 4 hours PRN,   Status:  Discontinued     01/18/18 2303    amoxicillin-clavulanate (AUGMENTIN) 875-125 MG tablet  Every 12 hours     01/18/18 2304    guaiFENesin (ROBITUSSIN) 100 MG/5ML liquid  Every 4 hours PRN     01/18/18 2304    promethazine-dextromethorphan (PROMETHAZINE-DM) 6.25-15 MG/5ML syrup  4 times daily PRN     01/18/18 2315           McDonald, Pedro Earls A, PA-C 01/19/18 Lawson Fiscal, MD 01/19/18 [phone]

## 2020-04-26 ENCOUNTER — Encounter (HOSPITAL_COMMUNITY): Payer: Self-pay | Admitting: Emergency Medicine

## 2020-04-26 ENCOUNTER — Other Ambulatory Visit: Payer: Self-pay

## 2020-04-26 ENCOUNTER — Emergency Department (HOSPITAL_COMMUNITY)
Admission: EM | Admit: 2020-04-26 | Discharge: 2020-04-27 | Disposition: A | Discharge status: Home / Self Care | Payer: Self-pay | Attending: Emergency Medicine | Admitting: Emergency Medicine

## 2020-04-26 DIAGNOSIS — M545 Low back pain, unspecified: Secondary | ICD-10-CM

## 2020-04-26 DIAGNOSIS — Z87891 Personal history of nicotine dependence: Secondary | ICD-10-CM | POA: Insufficient documentation

## 2020-04-26 NOTE — ED Triage Notes (Signed)
Patient reports rectal pain with spasms onset yesterday , denies injury , no bleeding or constipation .

## 2020-04-27 ENCOUNTER — Emergency Department (HOSPITAL_COMMUNITY): Payer: Self-pay

## 2020-04-27 LAB — CBC WITH DIFFERENTIAL/PLATELET
Abs Immature Granulocytes: 0.12 10*3/uL — ABNORMAL HIGH (ref 0.00–0.07)
Basophils Absolute: 0.1 10*3/uL (ref 0.0–0.1)
Basophils Relative: 1 %
Eosinophils Absolute: 0.2 10*3/uL (ref 0.0–0.5)
Eosinophils Relative: 2 %
HCT: 48.2 % (ref 39.0–52.0)
Hemoglobin: 15.8 g/dL (ref 13.0–17.0)
Immature Granulocytes: 1 %
Lymphocytes Relative: 21 %
Lymphs Abs: 2.4 10*3/uL (ref 0.7–4.0)
MCH: 32.4 pg (ref 26.0–34.0)
MCHC: 32.8 g/dL (ref 30.0–36.0)
MCV: 99 fL (ref 80.0–100.0)
Monocytes Absolute: 1 10*3/uL (ref 0.1–1.0)
Monocytes Relative: 9 %
Neutro Abs: 7.4 10*3/uL (ref 1.7–7.7)
Neutrophils Relative %: 66 %
Platelets: 219 10*3/uL (ref 150–400)
RBC: 4.87 MIL/uL (ref 4.22–5.81)
RDW: 12.8 % (ref 11.5–15.5)
WBC: 11.2 10*3/uL — ABNORMAL HIGH (ref 4.0–10.5)
nRBC: 0 % (ref 0.0–0.2)

## 2020-04-27 LAB — BASIC METABOLIC PANEL
Anion gap: 9 (ref 5–15)
BUN: 17 mg/dL (ref 6–20)
CO2: 25 mmol/L (ref 22–32)
Calcium: 8.5 mg/dL — ABNORMAL LOW (ref 8.9–10.3)
Chloride: 102 mmol/L (ref 98–111)
Creatinine, Ser: 1.35 mg/dL — ABNORMAL HIGH (ref 0.61–1.24)
GFR, Estimated: >60 mL/min (ref >60)
Glucose, Bld: 96 mg/dL (ref 70–99)
Potassium: 4.4 mmol/L (ref 3.5–5.1)
Sodium: 136 mmol/L (ref 135–145)

## 2020-04-27 MED ORDER — KETOROLAC TROMETHAMINE 15 MG/ML IJ SOLN
15.0000 mg | Freq: Once | INTRAMUSCULAR | Status: AC
  Administered 2020-04-27: 15 mg via INTRAVENOUS
  Filled 2020-04-27: qty 1

## 2020-04-27 MED ORDER — NAPROXEN 500 MG PO TABS: 500.0000 mg | ORAL_TABLET | Freq: Two times a day (BID) | ORAL | 0 refills | Status: DC

## 2020-04-27 MED ORDER — MORPHINE SULFATE (PF) 4 MG/ML IV SOLN
4.0000 mg | Freq: Once | INTRAVENOUS | Status: AC
  Administered 2020-04-27: 4 mg via INTRAVENOUS
  Filled 2020-04-27: qty 1

## 2020-04-27 MED ORDER — IOHEXOL 300 MG/ML  SOLN
100.0000 mL | Freq: Once | INTRAMUSCULAR | Status: AC | PRN
  Administered 2020-04-27: 100 mL via INTRAVENOUS

## 2020-04-27 NOTE — Discharge Instructions (Signed)
Call your primary care doctor or specialist as discussed in the next 2-3 days.   Return immediately back to the ER if:  Your symptoms worsen within the next 12-24 hours. You develop new symptoms such as new fevers, persistent vomiting, new pain, shortness of breath, or new weakness or numbness, or if you have any other concerns.  

## 2020-04-27 NOTE — ED Provider Notes (Signed)
[facility] EMERGENCY DEPARTMENT Provider Note   CSN: [id number] Arrival date & time: 04/26/20  2227     History Chief Complaint  Patient presents with  . Rectal Pain     Marcus S[facility]ey is a 45 y.o. male.  Patient presents ER chief complaint of lower back pain/buttock/rectal pain.  Describes it as persistent ache worse when he moves a certain way.  Pain has been there since this morning.  Denies any instigating cause.  Denies fevers or cough or vomiting or diarrhea.        History reviewed. No pertinent past medical history.  There are no problems to display for this patient.   History reviewed. No pertinent surgical history.     No family history on file.  Social History   Tobacco Use  . Smoking status: Former Smoker    Types: Cigarettes    Quit date: 07/19/2012    Years since quitting: 7.7  . Smokeless tobacco: Never Used  Substance Use Topics  . Alcohol use: No    Comment: "once a week"  . Drug use: No    Home Medications Prior to Admission medications   Medication Sig Start Date End Date Taking? Authorizing Provider  naproxen (NAPROSYN) 500 MG tablet Take 1 tablet (500 mg total) by mouth 2 (two) times daily. 04/27/20  Yes Cheryll Cockayne, MD    Allergies    Patient has no known allergies.  Review of Systems   Review of Systems  Constitutional: Negative for fever.  HENT: Negative for ear pain and sore throat.   Eyes: Negative for pain.  Respiratory: Negative for cough.   Cardiovascular: Negative for chest pain.  Gastrointestinal: Negative for abdominal pain.  Genitourinary: Negative for flank pain.  Musculoskeletal: Positive for back pain.  Skin: Negative for color change and rash.  Neurological: Negative for syncope.  All other systems reviewed and are negative.   Physical Exam Updated Vital Signs BP 129/71 (BP Location: Right Arm)   Pulse 87   Temp 98.3 F (36.8 C)   Resp 16   Ht 6\' 1"  (1.854 m)   Wt 110 kg   SpO2  100%   BMI 31.99 kg/m   Physical Exam Constitutional:      General: He is not in acute distress.    Appearance: He is well-developed.  HENT:     Head: Normocephalic.     Nose: Nose normal.  Eyes:     Extraocular Movements: Extraocular movements intact.  Cardiovascular:     Rate and Rhythm: Normal rate.  Pulmonary:     Effort: Pulmonary effort is normal.  Genitourinary:    Comments: No perirectal abscess or lesion noted.  Normal rectal exam that is nontender.  No rectal fissures noted.  Maxillary tenderness in the gluteal cleft just superior to rectum.  No fluctuant lesion palpated here. Skin:    Coloration: Skin is not jaundiced.  Neurological:     Mental Status: He is alert. Mental status is at baseline.     ED Results / Procedures / Treatments   Labs (all labs ordered are listed, but only abnormal results are displayed) Labs Reviewed  BASIC METABOLIC PANEL - Abnormal; Notable for the following components:      Result Value   Creatinine, Ser 1.35 (*)    Calcium 8.5 (*)    All other components within normal limits  CBC WITH DIFFERENTIAL/PLATELET - Abnormal; Notable for the following components:   WBC 11.2 (*)    Abs  Immature Granulocytes 0.12 (*)    All other components within normal limits    EKG None  Radiology CT PELVIS W CONTRAST  Result Date: 04/27/2020 CLINICAL DATA:  Rectal abscess EXAM: CT PELVIS WITH CONTRAST TECHNIQUE: Multidetector CT imaging of the pelvis was performed using the standard protocol following the bolus administration of intravenous contrast. CONTRAST:  OMNIPAQUE IOHEXOL 300 MG/ML  SOLN COMPARISON:  None. FINDINGS: Urinary Tract: The visualized distal ureters are decompressed. The bladder is unremarkable peer Bowel: The visualized large and small bowel are unremarkable. Appendix normal. No free intraperitoneal fluid. The rectum is unremarkable. No evidence of perirectal collection or inflammatory change. Vascular/Lymphatic: The pelvic  vasculature is unremarkable. Reproductive: The prostate gland and seminal vesicles are unremarkable. Other:  Tiny fat containing umbilical hernia. Musculoskeletal: No acute bone abnormality. No lytic or blastic bone lesion. IMPRESSION: Normal examination. No evidence of perirectal abscess or inflammation. Electronically Signed   By: Helyn Numbers MD   On: 04/27/2020 03:54    Procedures Procedures   Medications Ordered in ED Medications  ketorolac (TORADOL) 15 MG/ML injection 15 mg (15 mg Intravenous Given 04/27/20 0220)  morphine 4 MG/ML injection 4 mg (4 mg Intravenous Given 04/27/20 0219)  iohexol (OMNIPAQUE) 300 MG/ML solution 100 mL (100 mLs Intravenous Contrast Given 04/27/20 0347)    ED Course  I have reviewed the triage vital signs and the nursing notes.  Pertinent labs & imaging results that were available during my care of the patient were reviewed by me and considered in my medical decision making (see chart for details).    MDM Rules/Calculators/A&P                          Labs sent unremarkable, CT imaging pursued.  Showing no perirectal abscess or abnormality on imaging.  Patient given IV Toradol and morphine for pain management.  Advising outpatient follow-up with his doctor within the week.  Advised return if he has fevers worsening pain or any additional concerns  Final Clinical Impression(s) / ED Diagnoses Final diagnoses:  Acute midline low back pain without sciatica    Rx / DC Orders ED Discharge Orders         Ordered    naproxen (NAPROSYN) 500 MG tablet  2 times daily        04/27/20 0400           Cheryll Cockayne, MD 04/27/20 0400

## 2021-04-13 ENCOUNTER — Emergency Department (HOSPITAL_COMMUNITY)
Admission: EM | Admit: 2021-04-13 | Discharge: 2021-04-13 | Disposition: A | Discharge status: Home / Self Care | Payer: Self-pay | Attending: Emergency Medicine | Admitting: Emergency Medicine

## 2021-04-13 ENCOUNTER — Emergency Department (HOSPITAL_COMMUNITY): Payer: Self-pay

## 2021-04-13 ENCOUNTER — Other Ambulatory Visit: Payer: Self-pay

## 2021-04-13 ENCOUNTER — Encounter (HOSPITAL_COMMUNITY): Payer: Self-pay

## 2021-04-13 DIAGNOSIS — Y92410 Unspecified street and highway as the place of occurrence of the external cause: Secondary | ICD-10-CM | POA: Insufficient documentation

## 2021-04-13 DIAGNOSIS — S2232XA Fracture of one rib, left side, initial encounter for closed fracture: Secondary | ICD-10-CM | POA: Insufficient documentation

## 2021-04-13 DIAGNOSIS — S299XXA Unspecified injury of thorax, initial encounter: Secondary | ICD-10-CM | POA: Diagnosis present

## 2021-04-13 MED ORDER — IBUPROFEN 800 MG PO TABS
800.0000 mg | ORAL_TABLET | Freq: Once | ORAL | Status: AC
  Administered 2021-04-13: 800 mg via ORAL
  Filled 2021-04-13: qty 1

## 2021-04-13 MED ORDER — LIDOCAINE 5 % EX PTCH: 1.0000 | MEDICATED_PATCH | CUTANEOUS | 0 refills | Status: AC

## 2021-04-13 MED ORDER — ACETAMINOPHEN 500 MG PO TABS
1000.0000 mg | ORAL_TABLET | Freq: Once | ORAL | Status: AC
  Administered 2021-04-13: 1000 mg via ORAL
  Filled 2021-04-13: qty 2

## 2021-04-13 MED ORDER — NAPROXEN 500 MG PO TABS: 500.0000 mg | ORAL_TABLET | Freq: Two times a day (BID) | ORAL | 0 refills | Status: AC

## 2021-04-13 NOTE — ED Notes (Signed)
Pt in xray
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
## 2021-05-23 ENCOUNTER — Encounter (HOSPITAL_COMMUNITY): Payer: Self-pay | Admitting: Emergency Medicine

## 2021-05-23 ENCOUNTER — Emergency Department (HOSPITAL_COMMUNITY)
Admission: EM | Admit: 2021-05-23 | Discharge: 2021-05-23 | Disposition: A | Discharge status: Home / Self Care | Payer: Self-pay | Attending: Emergency Medicine | Admitting: Emergency Medicine

## 2021-05-23 DIAGNOSIS — Y900 Blood alcohol level of less than 20 mg/100 ml: Secondary | ICD-10-CM | POA: Insufficient documentation

## 2021-05-23 DIAGNOSIS — J029 Acute pharyngitis, unspecified: Secondary | ICD-10-CM | POA: Insufficient documentation

## 2021-05-23 DIAGNOSIS — F10920 Alcohol use, unspecified with intoxication, uncomplicated: Secondary | ICD-10-CM | POA: Insufficient documentation

## 2021-05-23 DIAGNOSIS — R000 Tachycardia, unspecified: Secondary | ICD-10-CM | POA: Insufficient documentation

## 2021-05-23 LAB — COMPREHENSIVE METABOLIC PANEL
ALT: 15 U/L (ref 0–44)
AST: 15 U/L (ref 15–41)
Albumin: 3.6 g/dL (ref 3.5–5.0)
Alkaline Phosphatase: 88 U/L (ref 38–126)
Anion gap: 8 (ref 5–15)
BUN: <5 mg/dL — ABNORMAL LOW (ref 6–20)
CO2: 23 mmol/L (ref 22–32)
Calcium: 9 mg/dL (ref 8.9–10.3)
Chloride: 103 mmol/L (ref 98–111)
Creatinine, Ser: 0.96 mg/dL (ref 0.61–1.24)
GFR, Estimated: >60 mL/min (ref >60)
Glucose, Bld: 162 mg/dL — ABNORMAL HIGH (ref 70–99)
Potassium: 3.4 mmol/L — ABNORMAL LOW (ref 3.5–5.1)
Sodium: 134 mmol/L — ABNORMAL LOW (ref 135–145)
Total Bilirubin: 0.8 mg/dL (ref 0.3–1.2)
Total Protein: 6.9 g/dL (ref 6.5–8.1)

## 2021-05-23 LAB — CBC
HCT: 44.4 % (ref 39.0–52.0)
Hemoglobin: 15.2 g/dL (ref 13.0–17.0)
MCH: 32.8 pg (ref 26.0–34.0)
MCHC: 34.2 g/dL (ref 30.0–36.0)
MCV: 95.7 fL (ref 80.0–100.0)
Platelets: 290 10*3/uL (ref 150–400)
RBC: 4.64 MIL/uL (ref 4.22–5.81)
RDW: 13 % (ref 11.5–15.5)
WBC: 12 10*3/uL — ABNORMAL HIGH (ref 4.0–10.5)
nRBC: 0 % (ref 0.0–0.2)

## 2021-05-23 LAB — TROPONIN I (HIGH SENSITIVITY)
Troponin I (High Sensitivity): 4 ng/L (ref <18)
Troponin I (High Sensitivity): 4 ng/L (ref <18)

## 2021-05-23 LAB — GROUP A STREP BY PCR: Group A Strep by PCR: NOT DETECTED

## 2021-05-23 LAB — ETHANOL: Alcohol, Ethyl (B): <10 mg/dL (ref <10)

## 2021-05-23 LAB — CBG MONITORING, ED: Glucose-Capillary: 142 mg/dL — ABNORMAL HIGH (ref 70–99)

## 2021-05-23 MED ORDER — KETOROLAC TROMETHAMINE 30 MG/ML IJ SOLN
30.0000 mg | Freq: Once | INTRAMUSCULAR | Status: AC
  Administered 2021-05-23: 30 mg via INTRAVENOUS
  Filled 2021-05-23: qty 1

## 2021-05-23 MED ORDER — SODIUM CHLORIDE 0.9 % IV BOLUS
1000.0000 mL | Freq: Once | INTRAVENOUS | Status: AC
  Administered 2021-05-23: 1000 mL via INTRAVENOUS

## 2021-05-23 MED ORDER — DEXAMETHASONE SODIUM PHOSPHATE 10 MG/ML IJ SOLN
10.0000 mg | Freq: Once | INTRAMUSCULAR | Status: AC
Start: 2021-05-23 — End: 2021-05-23
  Administered 2021-05-23: 10 mg via INTRAVENOUS
  Filled 2021-05-23: qty 1

## 2021-05-23 NOTE — ED Notes (Signed)
Pt tolerating PO challenge. Ambulatory with steady gait.  ?

## 2021-05-23 NOTE — Discharge Instructions (Addendum)
You were seen in the emergency department for sore throat possible allergic reaction.  Your symptoms improved with some IV fluids pain medication and steroids.  You can continue Tylenol and ibuprofen as needed.  Your strep test was negative.  Please return to the emergency department if any worsening or concerning symptoms. ?

## 2021-05-23 NOTE — ED Triage Notes (Signed)
Pt reports his throat/tongue is swollen however upon further evaluation "it just really hurts to swallow.  Pt is able to control his saliva and speak in complete sentences.  Pt does admit to ETOH and cocaine tonight.  Pt is having trouble staying awake in triage and eyes are red.   ?

## 2021-05-23 NOTE — ED Provider Notes (Signed)
?[facility] EMERGENCY DEPARTMENT ?Provider Note ? ? ?CSN: [id number] ?Arrival date & time: 05/23/21  4431 ? ?  ? ?History ? ?Chief Complaint  ?Patient presents with  ? Sore Throat  ?   ?  ? Alcohol Intoxication  ? ? ?Marcus Sweeney is a 46 y.o. male.  He is here for evaluation of swollen tongue sore throat difficulty swallowing that started earlier today.  He cannot tell me if there was any precipitating factors.  ? Allergic reaction, ate chicken. He denies any fevers chills cough.  He has some nasal congestion.  No prior history of same.  He has tried nothing for it. ? ?The history is provided by the patient.  ?Sore Throat ?This is a new problem. The current episode started 6 to 12 hours ago. The problem occurs constantly. The problem has not changed since onset.Pertinent negatives include no chest pain, no abdominal pain, no headaches and no shortness of breath. The symptoms are aggravated by swallowing. Nothing relieves the symptoms. He has tried nothing for the symptoms. The treatment provided no relief.  ?Alcohol Intoxication ?Pertinent negatives include no chest pain, no abdominal pain, no headaches and no shortness of breath.  ? ?  ? ?Home Medications ?Prior to Admission medications   ?Medication Sig Start Date End Date Taking? Authorizing Provider  ?lidocaine (LIDODERM) 5 % Place 1 patch onto the skin daily. Remove & Discard patch within 12 hours or as directed by MD 04/13/21   Cristopher Peru, PA-C  ?naproxen (NAPROSYN) 500 MG tablet Take 1 tablet (500 mg total) by mouth 2 (two) times daily. 04/13/21   Cristopher Peru, PA-C  ?   ? ?Allergies    ?Patient has no known allergies.   ? ?Review of Systems   ?Review of Systems  ?Constitutional:  Negative for fever.  ?HENT:  Positive for sore throat and trouble swallowing.   ?Respiratory:  Negative for shortness of breath.   ?Cardiovascular:  Negative for chest pain.  ?Gastrointestinal:  Negative for abdominal pain.  ?Musculoskeletal:  Negative for  neck pain.  ?Neurological:  Negative for headaches.  ? ?Physical Exam ?Updated Vital Signs ?BP 106/69   Pulse (!) 124   Temp 97.9 ?F (36.6 ?C) (Oral)   Resp 20   SpO2 95%  ?Physical Exam ?Vitals and nursing note reviewed.  ?Constitutional:   ?   General: He is not in acute distress. ?   Appearance: Normal appearance. He is well-developed.  ?HENT:  ?   Head: Normocephalic and atraumatic.  ?   Nose: Rhinorrhea present.  ?   Mouth/Throat:  ?   Mouth: Mucous membranes are moist. No oral lesions.  ?   Pharynx: Posterior oropharyngeal erythema present. No pharyngeal swelling, oropharyngeal exudate or uvula swelling.  ?   Tonsils: No tonsillar exudate or tonsillar abscesses.  ?Eyes:  ?   Comments: Bilateral conjunctival injection  ?Cardiovascular:  ?   Rate and Rhythm: Regular rhythm. Tachycardia present.  ?   Heart sounds: No murmur heard. ?Pulmonary:  ?   Effort: Pulmonary effort is normal. No respiratory distress.  ?   Breath sounds: Normal breath sounds.  ?Abdominal:  ?   Palpations: Abdomen is soft.  ?   Tenderness: There is no abdominal tenderness. There is no guarding.  ?Musculoskeletal:     ?   General: No swelling.  ?   Cervical back: Neck supple.  ?Skin: ?   General: Skin is warm and dry.  ?   Capillary Refill:  Capillary refill takes less than 2 seconds.  ?Neurological:  ?   General: No focal deficit present.  ?   Mental Status: He is easily aroused.  ? ? ?ED Results / Procedures / Treatments   ?Labs ?(all labs ordered are listed, but only abnormal results are displayed) ?Labs Reviewed  ?COMPREHENSIVE METABOLIC PANEL - Abnormal; Notable for the following components:  ?    Result Value  ? Sodium 134 (*)   ? Potassium 3.4 (*)   ? Glucose, Bld 162 (*)   ? BUN <5 (*)   ? All other components within normal limits  ?CBC - Abnormal; Notable for the following components:  ? WBC 12.0 (*)   ? All other components within normal limits  ?CBG MONITORING, ED - Abnormal; Notable for the following components:  ?  Glucose-Capillary 142 (*)   ? All other components within normal limits  ?GROUP A STREP BY PCR  ?ETHANOL  ?TROPONIN I (HIGH SENSITIVITY)  ?TROPONIN I (HIGH SENSITIVITY)  ? ? ?EKG ?EKG Interpretation ? ?Date/Time:  Sunday May 23 2021 05:39:14 EDT ?Ventricular Rate:  124 ?PR Interval:  136 ?QRS Duration: 80 ?QT Interval:  314 ?QTC Calculation: 451 ?R Axis:   36 ?Text Interpretation: Sinus tachycardia Nonspecific ST abnormality Abnormal ECG When compared with ECG of 02-Jan-2018 23:21, rate is faster Confirmed by Meridee Score [phone]) on 05/23/2021 8:25:25 AM ? ?Radiology ?No results found. ? ?Procedures ?Procedures  ? ? ?Medications Ordered in ED ?Medications  ?dexamethasone (DECADRON) injection 10 mg (10 mg Intravenous Given 05/23/21 0811)  ?sodium chloride 0.9 % bolus 1,000 mL (0 mLs Intravenous Stopped 05/23/21 1057)  ?ketorolac (TORADOL) 30 MG/ML injection 30 mg (30 mg Intravenous Given 05/23/21 0820)  ? ? ?ED Course/ Medical Decision Making/ A&P ?  ?                        ?Medical Decision Making ?Amount and/or Complexity of Data Reviewed ?Labs: ordered. ? ?Risk ?Prescription drug management. ? ?This patient complains of sore throat tongue swelling; this involves an extensive number of treatment ?Options and is a complaint that carries with it a high risk of complications and ?morbidity. The differential includes pharyngitis, allergic reaction, angioedema, airway compromise ? ?I ordered, reviewed and interpreted labs, which included CBC with mildly elevated white count normal hemoglobin, chemistries with mildly low sodium and potassium elevated glucose, strep negative, troponin unremarkable ?I ordered medication IV Decadron fluids Toradol with improvement in his symptoms and reviewed PMP when indicated. ?Additional history obtained from patient's uncle ?Previous records obtained and reviewed in epic no recent admissions ?Cardiac monitoring reviewed, normal sinus rhythm ?Social determinants considered, no significant  barriers ?Critical Interventions: None ? ?After the interventions stated above, I reevaluated the patient and found patient to be symptomatically improved tolerating p.o. ?Admission and further testing considered, no indications for admission or further work-up at this time.  Return instructions discussed ? ? ? ? ? ? ? ? ? ?Final Clinical Impression(s) / ED Diagnoses ?Final diagnoses:  ?Sore throat  ? ? ?Rx / DC Orders ?ED Discharge Orders   ? ? None  ? ?  ? ? ?  ?Terrilee Files, MD ?05/23/21 1824 ? ?

## 2022-01-30 ENCOUNTER — Emergency Department (HOSPITAL_COMMUNITY)
Admission: EM | Admit: 2022-01-30 | Discharge: 2022-01-30 | Disposition: A | Discharge status: Home / Self Care | Payer: Self-pay | Attending: Emergency Medicine | Admitting: Emergency Medicine

## 2022-01-30 DIAGNOSIS — N509 Disorder of male genital organs, unspecified: Secondary | ICD-10-CM | POA: Insufficient documentation

## 2022-01-30 DIAGNOSIS — R1013 Epigastric pain: Secondary | ICD-10-CM | POA: Insufficient documentation

## 2022-01-30 DIAGNOSIS — N5089 Other specified disorders of the male genital organs: Secondary | ICD-10-CM

## 2022-01-30 LAB — COMPREHENSIVE METABOLIC PANEL
ALT: 25 U/L (ref 0–44)
AST: 22 U/L (ref 15–41)
Albumin: 3.5 g/dL (ref 3.5–5.0)
Alkaline Phosphatase: 110 U/L (ref 38–126)
Anion gap: 6 (ref 5–15)
BUN: 9 mg/dL (ref 6–20)
CO2: 27 mmol/L (ref 22–32)
Calcium: 9.1 mg/dL (ref 8.9–10.3)
Chloride: 106 mmol/L (ref 98–111)
Creatinine, Ser: 1.32 mg/dL — ABNORMAL HIGH (ref 0.61–1.24)
GFR, Estimated: >60 mL/min (ref >60)
Glucose, Bld: 90 mg/dL (ref 70–99)
Potassium: 4.7 mmol/L (ref 3.5–5.1)
Sodium: 139 mmol/L (ref 135–145)
Total Bilirubin: 0.7 mg/dL (ref 0.3–1.2)
Total Protein: 7.9 g/dL (ref 6.5–8.1)

## 2022-01-30 LAB — URINALYSIS, ROUTINE W REFLEX MICROSCOPIC
Bilirubin Urine: NEGATIVE
Glucose, UA: NEGATIVE mg/dL
Hgb urine dipstick: NEGATIVE
Ketones, ur: NEGATIVE mg/dL
Leukocytes,Ua: NEGATIVE
Nitrite: NEGATIVE
Protein, ur: NEGATIVE mg/dL
Specific Gravity, Urine: 1.023 (ref 1.005–1.030)
pH: 5 (ref 5.0–8.0)

## 2022-01-30 LAB — CBC WITH DIFFERENTIAL/PLATELET
Abs Immature Granulocytes: 0.04 10*3/uL (ref 0.00–0.07)
Basophils Absolute: 0.1 10*3/uL (ref 0.0–0.1)
Basophils Relative: 1 %
Eosinophils Absolute: 0.4 10*3/uL (ref 0.0–0.5)
Eosinophils Relative: 4 %
HCT: 45 % (ref 39.0–52.0)
Hemoglobin: 14.9 g/dL (ref 13.0–17.0)
Immature Granulocytes: 1 %
Lymphocytes Relative: 15 %
Lymphs Abs: 1.3 10*3/uL (ref 0.7–4.0)
MCH: 31.8 pg (ref 26.0–34.0)
MCHC: 33.1 g/dL (ref 30.0–36.0)
MCV: 96.2 fL (ref 80.0–100.0)
Monocytes Absolute: 1.1 10*3/uL — ABNORMAL HIGH (ref 0.1–1.0)
Monocytes Relative: 14 %
Neutro Abs: 5.3 10*3/uL (ref 1.7–7.7)
Neutrophils Relative %: 65 %
Platelets: 304 10*3/uL (ref 150–400)
RBC: 4.68 MIL/uL (ref 4.22–5.81)
RDW: 13.2 % (ref 11.5–15.5)
WBC: 8.1 10*3/uL (ref 4.0–10.5)
nRBC: 0 % (ref 0.0–0.2)

## 2022-01-30 LAB — LIPASE, BLOOD: Lipase: 52 U/L — ABNORMAL HIGH (ref 11–51)

## 2022-01-30 LAB — HIV ANTIBODY (ROUTINE TESTING W REFLEX): HIV Screen 4th Generation wRfx: NONREACTIVE

## 2022-01-30 MED ORDER — PENICILLIN G BENZATHINE 1200000 UNIT/2ML IM SUSY
2.4000 10*6.[IU] | PREFILLED_SYRINGE | Freq: Once | INTRAMUSCULAR | Status: AC
  Administered 2022-01-30: 2.4 10*6.[IU] via INTRAMUSCULAR
  Filled 2022-01-30: qty 4

## 2022-01-30 NOTE — ED Provider Notes (Signed)
[facility] EMERGENCY DEPARTMENT Provider Note   CSN: [id number] Arrival date & time: 01/30/22  1427     History  Chief Complaint  Patient presents with   Abdominal Pain    Marcus Sweeney is a 46 y.o. male.  He has no significant past medical history.  He is here initially with a complaint of abdominal pain and 1 episode of vomiting that started yesterday.  He said his pain is much improved and is not really worried about that now.  He denies prior history of same.  No fevers or chills.  He is more concerned that he recently tried to donate plasma and they told him that his syphilis test was indeterminate.  He has noticed some lesions over his scrotum and his penis that started yesterday.  No dysuria.  He is sexually active and not using condoms with his male partner.  He denies any sex with males.  No history of HIV  The history is provided by the patient.  Abdominal Pain Pain location:  Generalized Pain quality: cramping   Pain radiates to:  Does not radiate Pain severity:  Mild Onset quality:  Gradual Duration:  2 days Timing:  Intermittent Progression:  Improving Chronicity:  New Context: not trauma   Relieved by:  None tried Worsened by:  Nothing Ineffective treatments:  None tried Associated symptoms: vomiting   Associated symptoms: no chest pain, no diarrhea, no dysuria and no fever   Male GU Problem Presenting symptoms: scrotal pain   Presenting symptoms: no dysuria   Associated symptoms: abdominal pain and vomiting   Associated symptoms: no diarrhea and no fever        Home Medications Prior to Admission medications   Medication Sig Start Date End Date Taking? Authorizing Provider  lidocaine (LIDODERM) 5 % Place 1 patch onto the skin daily. Remove & Discard patch within 12 hours or as directed by MD 04/13/21   Cristopher Peru, PA-C  naproxen (NAPROSYN) 500 MG tablet Take 1 tablet (500 mg total) by mouth 2 (two) times daily. 04/13/21    Cristopher Peru, PA-C      Allergies    Patient has no known allergies.    Review of Systems   Review of Systems  Constitutional:  Negative for fever.  Cardiovascular:  Negative for chest pain.  Gastrointestinal:  Positive for abdominal pain and vomiting. Negative for diarrhea.  Genitourinary:  Positive for genital sores. Negative for dysuria.    Physical Exam Updated Vital Signs BP 117/69   Pulse (!) 103   Temp 98.6 F (37 C)   Resp 18   SpO2 95%  Physical Exam Vitals and nursing note reviewed.  Constitutional:      General: He is not in acute distress.    Appearance: He is well-developed.  HENT:     Head: Normocephalic and atraumatic.  Eyes:     Conjunctiva/sclera: Conjunctivae normal.  Cardiovascular:     Rate and Rhythm: Normal rate and regular rhythm.     Heart sounds: No murmur heard. Pulmonary:     Effort: Pulmonary effort is normal. No respiratory distress.     Breath sounds: Normal breath sounds.  Abdominal:     Palpations: Abdomen is soft.     Tenderness: There is no abdominal tenderness. There is no guarding or rebound.  Genitourinary:    Comments: he has multiple superficial lesions over his scrotum and penile shaft.  They are circular round 1 cm superficial ulcerations. Musculoskeletal:  General: No swelling.     Cervical back: Neck supple.  Skin:    General: Skin is warm and dry.     Capillary Refill: Capillary refill takes less than 2 seconds.  Neurological:     General: No focal deficit present.     Mental Status: He is alert.     ED Results / Procedures / Treatments   Labs (all labs ordered are listed, but only abnormal results are displayed) Labs Reviewed  CBC WITH DIFFERENTIAL/PLATELET - Abnormal; Notable for the following components:      Result Value   Monocytes Absolute 1.1 (*)    All other components within normal limits  COMPREHENSIVE METABOLIC PANEL - Abnormal; Notable for the following components:   Creatinine, Ser 1.32  (*)    All other components within normal limits  LIPASE, BLOOD - Abnormal; Notable for the following components:   Lipase 52 (*)    All other components within normal limits  RPR - Abnormal; Notable for the following components:   RPR Ser Ql Reactive (*)    All other components within normal limits  URINALYSIS, ROUTINE W REFLEX MICROSCOPIC  HIV ANTIBODY (ROUTINE TESTING W REFLEX)  HIV-1/HIV-2 QUAL RNA  T.PALLIDUM AB, TOTAL  GC/CHLAMYDIA PROBE AMP ([employer]) NOT AT [facility]    EKG None  Radiology No results found.  Procedures Procedures    Medications Ordered in ED Medications  penicillin g benzathine (BICILLIN LA) 1200000 UNIT/2ML injection 2.4 Million Units (2.4 Million Units Intramuscular Given 01/30/22 2159)    ED Course/ Medical Decision Making/ A&P Clinical Course as of 01/30/22 2134  Sun Jan 30, 2022  2133 Discussed with Dr. Algis Liming from infectious disease.  He is recommending treating the patient with penicillin and having him follow-up in the ID clinic. [MB]    Clinical Course User Index [MB] Terrilee Files, MD                           Medical Decision Making Risk Prescription drug management.   This patient complains of abdominal pain that is improving.  Also concern for genital lesions; this involves an extensive number of treatment Options and is a complaint that carries with it a high risk of complications and morbidity. The differential includes gastritis, peptic ulcer disease, diverticulitis, appendicitis, STD, syphilis  I ordered, reviewed and interpreted labs, which included CBC with normal white count, chemistries and LFTs fairly normal other than mildly elevated creatinine, urinalysis without signs of infection, RPR pending, HIV nonreactive I ordered medication IM penicillin and reviewed PMP when indicated. Previous records obtained and reviewed in epic no recent admissions I consulted Dr. Algis Liming infectious disease and discussed lab and imaging  findings and discussed disposition.  Social determinants considered, no significant barriers Critical Interventions: None  After the interventions stated above, I reevaluated the patient and found patient have a benign abdominal exam.  He declines further workup for abdominal pain. Admission and further testing considered, will cover with penicillin for probable syphilis and recommended outpatient follow-up with ID clinic.  Counseled patient that he is possibly infectious.  Return instructions discussed         Final Clinical Impression(s) / ED Diagnoses Final diagnoses:  Epigastric pain  Genital lesion, male    Rx / DC Orders ED Discharge Orders     None         Terrilee Files, MD 01/31/22 [phone]

## 2022-01-30 NOTE — Discharge Instructions (Addendum)
You were seen in the emergency department for evaluation of abdominal pain and possible positive syphilis test.  Your lab work was unremarkable.  We did give you treatment with antibiotics for possible syphilis.  Please follow-up with the infectious disease clinic for possible further treatment.

## 2022-01-30 NOTE — ED Provider Triage Note (Signed)
Emergency Medicine Provider Triage Evaluation Note  Marcus Sweeney , a 46 y.o. male  was evaluated in triage.  Pt complains of abdominal pain associated with vomiting that started yesterday.  No diarrhea.  No fever or chills.  Patient was told by [facility] that his results for syphilis were "inconclusive".  Patient admits to a lesion on his penis.  Review of Systems  Positive: Abdominal pain Negative: fever  Physical Exam  BP (!) 131/90 (BP Location: Right Arm)   Pulse 81   Temp 98.3 F (36.8 C)   Resp 16   SpO2 98%  Gen:   Awake, no distress   Resp:  Normal effort  MSK:   Moves extremities without difficulty  Other:    Medical Decision Making  Medically screening exam initiated at 2:40 PM.  Appropriate orders placed.  Marcus Sweeney was informed that the remainder of the evaluation will be completed by another provider, this initial triage assessment does not replace that evaluation, and the importance of remaining in the ED until their evaluation is complete.  labs   Marcus Sweeney, [place] 01/30/22 1441

## 2022-01-30 NOTE — ED Notes (Signed)
ED Provider at bedside. 

## 2022-01-30 NOTE — ED Notes (Signed)
RN reviewed discharge instructions with pt. Pt verbalized understanding and had no further questions. VSS upon discharge.  

## 2022-01-30 NOTE — ED Triage Notes (Signed)
Patient here for evaluation of abdominal pain that started yesterday, patient also states he was told by a [facility] center that he tested positive for syphillis yesterday. Patient reports vomiting, denies diarrhea.

## 2022-01-31 LAB — RPR
RPR Ser Ql: REACTIVE — AB
RPR Titer: 1:128 {titer}

## 2022-02-01 ENCOUNTER — Telehealth: Payer: Self-pay

## 2022-02-01 LAB — GC/CHLAMYDIA PROBE AMP (~~LOC~~) NOT AT ARMC
Chlamydia: NEGATIVE
Comment: NEGATIVE
Comment: NORMAL
Neisseria Gonorrhea: NEGATIVE

## 2022-02-01 LAB — T.PALLIDUM AB, TOTAL: T Pallidum Abs: REACTIVE — AB

## 2022-02-01 LAB — HIV-1/HIV-2 QUAL RNA
HIV-1 RNA, Qualitative: NONREACTIVE
HIV-2 RNA, Qualitative: NONREACTIVE

## 2022-02-01 NOTE — Telephone Encounter (Signed)
Attempted to call pt to schedule a [facility]. Tried to call # on file twice and each time rcvd the recording that the call cannot be completed as dialed. Pt does not have MyChart to try to schedule through MyChart.

## 2022-02-18 ENCOUNTER — Telehealth: Payer: Self-pay

## 2022-02-18 NOTE — Telephone Encounter (Signed)
Have attempted to call patient to schedule a hospital follow up multiple times, will ring and then continue to say call cannot be completed. No other number on file.
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
